# Patient Record
Sex: Female | Born: 1935
Health system: Southern US, Community
[De-identification: ages and names within clinical notes are randomized; demographics above are authoritative.]

## PROBLEM LIST (undated history)

## (undated) DIAGNOSIS — I10 Essential (primary) hypertension: Secondary | ICD-10-CM

## (undated) DIAGNOSIS — D649 Anemia, unspecified: Secondary | ICD-10-CM

## (undated) DIAGNOSIS — R55 Syncope and collapse: Secondary | ICD-10-CM

## (undated) DIAGNOSIS — I2510 Atherosclerotic heart disease of native coronary artery without angina pectoris: Secondary | ICD-10-CM

## (undated) DIAGNOSIS — N189 Chronic kidney disease, unspecified: Secondary | ICD-10-CM

## (undated) DIAGNOSIS — D509 Iron deficiency anemia, unspecified: Secondary | ICD-10-CM

## (undated) DIAGNOSIS — E119 Type 2 diabetes mellitus without complications: Secondary | ICD-10-CM

## (undated) DIAGNOSIS — I517 Cardiomegaly: Secondary | ICD-10-CM

## (undated) DIAGNOSIS — E785 Hyperlipidemia, unspecified: Secondary | ICD-10-CM

## (undated) DIAGNOSIS — R001 Bradycardia, unspecified: Secondary | ICD-10-CM

## (undated) DIAGNOSIS — I480 Paroxysmal atrial fibrillation: Secondary | ICD-10-CM

## (undated) HISTORY — PX: CARDIAC CATHETERIZATION: SHX172

## (undated) HISTORY — PX: CORONARY ANGIOPLASTY: SHX604

## (undated) HISTORY — PX: CORONARY ARTERY BYPASS GRAFT: SHX141

## (undated) HISTORY — DX: Paroxysmal atrial fibrillation: I48.0

## (undated) HISTORY — PX: CHOLECYSTECTOMY: SHX55

## (undated) HISTORY — DX: Anemia, unspecified: D64.9

## (undated) HISTORY — DX: Cardiomegaly: I51.7

## (undated) HISTORY — DX: Chronic kidney disease, unspecified: N18.9

## (undated) HISTORY — DX: Atherosclerotic heart disease of native coronary artery without angina pectoris: I25.10

## (undated) HISTORY — DX: Syncope and collapse: R55

## (undated) HISTORY — DX: Essential (primary) hypertension: I10

## (undated) HISTORY — DX: Bradycardia, unspecified: R00.1

## (undated) HISTORY — DX: Iron deficiency anemia, unspecified: D50.9

## (undated) HISTORY — DX: Type 2 diabetes mellitus without complications: E11.9

## (undated) HISTORY — DX: Hyperlipidemia, unspecified: E78.5

---

## 1972-09-18 HISTORY — PX: PARTIAL HYSTERECTOMY: SHX80

## 2001-04-09 ENCOUNTER — Inpatient Hospital Stay (HOSPITAL_COMMUNITY): Admission: AD | Admit: 2001-04-09 | Discharge: 2001-04-17 | Payer: Self-pay | Admitting: Cardiology

## 2001-04-09 ENCOUNTER — Encounter: Payer: Self-pay | Admitting: Cardiology

## 2001-04-12 ENCOUNTER — Encounter: Payer: Self-pay | Admitting: Thoracic Surgery (Cardiothoracic Vascular Surgery)

## 2001-04-13 ENCOUNTER — Encounter: Payer: Self-pay | Admitting: Thoracic Surgery (Cardiothoracic Vascular Surgery)

## 2001-04-14 ENCOUNTER — Encounter: Payer: Self-pay | Admitting: Thoracic Surgery (Cardiothoracic Vascular Surgery)

## 2001-04-15 ENCOUNTER — Encounter: Payer: Self-pay | Admitting: Thoracic Surgery (Cardiothoracic Vascular Surgery)

## 2002-11-07 ENCOUNTER — Ambulatory Visit (HOSPITAL_COMMUNITY): Admission: RE | Admit: 2002-11-07 | Discharge: 2002-11-08 | Payer: Self-pay | Admitting: Internal Medicine

## 2002-11-07 ENCOUNTER — Encounter: Payer: Self-pay | Admitting: Cardiology

## 2005-04-24 ENCOUNTER — Ambulatory Visit: Payer: Self-pay | Admitting: Oncology

## 2005-07-05 ENCOUNTER — Ambulatory Visit: Payer: Self-pay | Admitting: Oncology

## 2005-09-07 ENCOUNTER — Ambulatory Visit: Payer: Self-pay | Admitting: Oncology

## 2005-11-09 ENCOUNTER — Ambulatory Visit: Payer: Self-pay | Admitting: Oncology

## 2006-01-04 ENCOUNTER — Ambulatory Visit: Payer: Self-pay | Admitting: Oncology

## 2006-03-01 ENCOUNTER — Ambulatory Visit: Payer: Self-pay | Admitting: Oncology

## 2006-04-26 ENCOUNTER — Ambulatory Visit: Payer: Self-pay | Admitting: Oncology

## 2006-06-21 ENCOUNTER — Ambulatory Visit: Payer: Self-pay | Admitting: Oncology

## 2006-08-16 ENCOUNTER — Ambulatory Visit: Payer: Self-pay | Admitting: Oncology

## 2006-10-11 ENCOUNTER — Ambulatory Visit: Payer: Self-pay | Admitting: Oncology

## 2007-05-23 ENCOUNTER — Ambulatory Visit: Payer: Self-pay | Admitting: Oncology

## 2011-02-03 NOTE — Op Note (Signed)
[facility]  Patient:    Regina Herrera, Regina Herrera                       MRN: [id number] Proc. Date: 03/13/01 Adm. Date:  SN:[id number] Attending:  Modesto Charon CC:         Shiv K. Harsh, M.D.  Dr. Marco Collie in [place], [place]. Olevia Perches, M.D. [facility]  CVTS office   Operative Report  PREOPERATIVE DIAGNOSIS:  Unstable angina, left main and severe two vessel coronary disease.  POSTOPERATIVE DIAGNOSIS:  Unstable angina, left main and severe two vessel coronary disease.  PROCEDURE:  Median sternotomy, extracorporeal circulation, coronary artery bypass grafting x 4 (LIMA to LAD, saphenous vein graft to first diagonal, saphenous vein graft to distal right coronary, saphenous vein graft to second obtuse marginal).  SURGEON:  Revonda Standard. Roxan Hockey, M.D.  ASSISTANT:  Sueanne Margarita, P.A.  ANESTHESIA:  General.  FINDINGS:  _______ overall good poor quality, several separate segments used for grafting from both thighs.  These were fair quality segments.  First diagonal fair quality target, LAD, RCA, and OM2 good quality targets.  Normal left ventricle.  Ascending aorta with no atherosclerosis.  CLINICAL NOTE:  The patient is a 77 year old female with non-insulin-dependent diabetes and a family history of coronary disease, as well as hypercholesterolemia.  She presented with an episode of unstable angina.  She actually had two episodes of the angina, and then related this to her doctor three days earlier.  She subsequently was admitted and workup was begun.  She was transferred to [facility] and had cardiac catheterization which revealed moderate left main disease with a 50% stenosis of complex proximal LAD with post-stenotic aneurysm, and involvement of first diagonal, and a 95% ostial RCA lesion.  The patient was referred for coronary artery bypass grafting.  The indications, risks, benefits, and alternative procedures were discussed in detail  with the patient.  She understood and accepted them, and agreed to proceed.  DESCRIPTION OF PROCEDURE:  The patient was brought to the preoperative area on April 12, 2001.  Lines were placed to monitor arterial, central venous, and pulmonary arterial pressure.  EKG leads were placed for continuous telemetry. The patient was taken to the operating room, anesthetized, and intubated.  A Foley catheter was placed and intravenous antibiotics were administered.  The chest, abdomen, and legs were prepped and draped in the usual fashion.  A median sternotomy was performed, and the left internal mammary artery was harvested in the standard fashion.  Of note, the mammary harvest was difficult secondary to close adherence of the mammary to the costal cartilages and the level of the third and fourth costal cartilages.  Sharp dissection was used in these regions rather than electrocautery.  The patient was fully heparinized prior to dividing the distal end of the mammary artery.  Simultaneously, an incision was then made in the medial aspect of the right leg and the greater saphenous vein was harvested from the groin downward.  The vein overall was of poor quality, but there were areas that were usable for grafting.  The pericardium was opened.  The ascending aorta was inspected and palpated. There was no palpable atherosclerotic disease.  The aorta was cannulated via concentric 2-0 Ethibond pledgeted pursestring sutures.  A dual stage venous cannula was placed via pursestring suture in the right atrial appendage. Cardiopulmonary bypass was instituted and the patient was cooled to 32 degrees Celsius.  The coronary arteries were  inspected and anastomotic sites were chosen.  The conduits were inspected and it was found that there was inadequate vein for all three of the bypasses.  The initial plan had been to bypass the LAD, distal right, and second obtuse marginal.  However, on intraoperative  inspection, the first diagonal which did not appear graftable by catheterization did appear graftable intraoperatively.  Therefore, an incision was made in the left groin, and the greater saphenous vein was harvested from the left thigh as well.  This vein was overall better quality than the right.  However, it did trifurcate in the mid thigh.  It became unusable below that point.  A foam pad was placed in the pericardium to protect the left phrenic nerve.  A temperature probe was placed in the myocardium septum and a cardioplegia cannula was placed in the ascending aorta.  The aorta was cross-clamped.  The left ventricle was emptied via the aortic root vent.  Cardiac arrest then was achieved with a combination of cold antegrade blood cardioplegia and topical ice saline.  Seven hundred cubic centimeters of cardioplegia was administered.  The myocardial septal temperature was 11 degrees Celsius.  The following distal anastomoses were performed.  First, a reverse saphenous vein graft was placed end-to-side to the distal right coronary artery.  This was a large caliber relatively thin-walled vein of fair quality.  The distal right was a 1.5 mm good quality target.  The anastomosis was performed end-to-side with a running 7-0 Prolene suture.  At the completion of the anastomosis, cardioplegia was administered.  There was good flow and there was good hemostasis.  Next, a reverse saphenous vein graft was placed end-to-side to the second obtuse marginal branch of the left circumflex coronary artery.  This saphenous vein also was of fair quality.  The second obtuse marginal was a 1.5 mm good quality target.  The anastomosis was performed end-to-side with a running 7-0 Prolene suture.  At the completion of the anastomosis, it was proved  proximally and distally to ensure patency.  Cardioplegia was administered. There was good flow and good hemostasis.  Next, a reverse saphenous vein graft was  placed end-to-side to the first diagonal.  This was a smaller caliber and still fair quality.  It was placed to the first diagonal which was a 1 mm fair quality vessel.  There was disease distal to the anastomosis, but a 1 mm probe did pass beyond the bifurcation. The anastomosis was performed end-to-side with a running 7-0 Prolene suture. At the completion of the anastomosis, there was good flow.  Cardioplegia was administered and there was good hemostasis.  Next, the left internal mammary artery was brought through a window in a pericardium anterior to the left phrenic nerve.  The distal end of the mammary artery was spatulated and was anastomosed end-to-side to the distal LAD.  The LAD was a 2 mm good quality target.  The mammary was 2.5 mm good quality conduit with excellent flow.  The anastomosis was performed with a running 8-0 Prolene suture.  At the completion of the mammary to LAD anastomosis, the bulldog clamp was removed from the mammary artery.  Immediate and rapid septal rewarming was noted.  Lidocaine was administered.  The mammary and pedicle were tacked to the epicardial surface of the heart with 6-0 Prolene sutures. The aortic cross-clamp was removed.  The total cross-clamp time was 49 minutes.  The patient resumed breathing spontaneously and did not require defibrillation.  A partial occlusion clamp was placed  on the ascending aorta.  The cardioplegia cannula was removed.  The proximal vein graft anastomoses were performed with 4.0 mm punch aortotomies with running 6-0 Prolene sutures.  At the completion of the final proximal anastomosis, the patient was placed in the Trendelenburg position, air was allowed to vent as the partial clamp was removed.  The suture then was tied, air was aspirated from the vein grafts.  The bulldog clamps were removed.  All proximal and distal anastomoses were inspected for hemostasis.  There was bleeding from the right coronary proximal  anastomosis. An attempt to fix this with a 6-0 Prolene sutures led to some distortion of the proximal segment of the vein.  Therefore, a partial occlusion clamp was placed back on the ascending aorta, and the anastomosis was taken down, and redone again using a running 6-0 Prolene suture.  Deairing maneuvers were performed again as the partial clamp was removed.  At this time, all proximal and distal anastomoses had good hemostasis. Epicardial pacing wires were placed on the right ventricle and right atrium. Rewarming had been begun during the mammary to LAD anastomosis, and when the core temperature reached 37 degrees Celsius, the patient was weaned from cardiopulmonary bypass.  She was atrially paced for rate and on no inotropic support at the time of separation from bypass.  The total bypass time was 192 minutes.  The initial cardiac index was greater than 2 L per minute per sq m.  The patient did have some variability in blood pressure, but maintained good cardiac index throughout the remainder of the procedure.  A test dose of Protamine was administered and was well-tolerated.  The atrial and aorta cannulae were removed.  The remainder of the Protamine was administered without incident.  An additional dose of antibiotics was given. The chest was irrigated with 1 L of warm normal saline containing 1 g of vancomycin.  Hemostasis was achieved.  The pericardium was loosely reapproximated with interrupted 3-0 silk sutures and came together easily without tension on the underlying structures.  The left pleural and two mediastinal chest tubes were placed.  The sternum was closed with heave gauge interrupted stainless steel wires.  The pectoralis fascia was closed with a running #1 Vicryl suture.  The subcutaneous tissue and the skin was closed in the routine fashion with a subcuticular skin suture.  The leg incisions were closed in two layers, and 19 Blake drains were left in both thighs,  and the skin was closed with staples.  All sponge, needle, and instruments were correct at the end of the procedure. The patient was taken from the operating room to the surgical intensive care unit intubated, but in stable condition. DD:  04/12/01 TD:  04/14/01 Job: [id number]

## 2011-02-03 NOTE — Cardiovascular Report (Signed)
NAME:  Regina Herrera, Regina Herrera                          ACCOUNT NO.:  [id number]   MEDICAL RECORD NO.:  [id number]                   PATIENT TYPE:  OIB   LOCATION:  2899                                 FACILITY:  [facility]   PHYSICIAN:  Ethelle Lyon, M.D. [facility]         DATE OF BIRTH:  Feb 19, 1927   DATE OF PROCEDURE:  11/07/2002  DATE OF DISCHARGE:                              CARDIAC CATHETERIZATION   PROCEDURE:  1. Left heart catheterization.  2. Left ventriculography.  3. Saphenous vein graft angiography x3.  4. Selective left subclavian angiography, selective LIMA angiography, stent     to the ostial RCA.   INDICATIONS:  The patient is a 77 year old lady status post coronary artery  bypass grafting in July 2002.  She has done well post bypass grafting  without angina.  However, Dr. Driscilla Moats obtained an exercise tolerance test  which was positive by electrocardiographic criteria.  He therefore referred  her for diagnostic angiography.   DIAGNOSTIC TECHNIQUE:  Informed consent was obtained.  Under 1% lidocaine  local anesthesia, a 6-French sheath was placed in the right femoral artery  using the modified Seldinger technique.  Diagnostic angiography was  performed using no-torque right, Judkins left catheters to image the native  coronaries, a JR-4 to image the saphenous vein grafts, JR-4 to engage the  left subclavian, and LIMA catheter for selective LIMA angiography.  Ventriculography was performed from the RAO projection by power injection.  The case then proceeded to intervention.   DIAGNOSTIC FINDINGS:  1. LV:  EF of approximately 65%.  No regional wall motion abnormality.  2. No aortic stenosis or mitral regurgitation.  3. LV:  164/0/12.  4. Left main:  Angiographically normal.  5. LAD:  The LAD has an ostial 90% lesion followed by a 90% lesion of the     proximal vessel just proximal to an aneurysm.  There is a large diagonal     arising just proximal to the aneurysm.  The LIMA to  the LAD is widely     patent with normal runoff.  The saphenous vein graft to D1 is also widely     patent with normal run off.  6. Circumflex:  The circumflex is a large, codominant vessel giving rise to     a single large branching obtuse marginal and a PDA.  The saphenous vein     graft to the marginal is widely patent.  7. RCA:  The RCA is a small, codominant vessel.  There is a 95% stenosis of     the ostium.  The saphenous vein graft to the RCA is occluded at its     ostium.  8. Left subclavian artery is widely patent without stenosis.   PCI TECHNIQUE:  Anticoagulation was initiated with heparin and Integrelin to  achieve an ACT of greater than 200 seconds.  I was unable to engage the  right coronary with multiple guides including AR-1,  AL-1, right hockey  stick, and right bypass graft.  Therefore, the graft was re-engaged, using  the diagnostic no-torque right catheter.  A Sport wire was advanced via this  catheter into the mid portion of the RCA.  The diagnostic catheter was then  removed over this wire.  The right bypass guide was then advanced over the  Sport wire and engaged in the ostium of the RCA.  The wire was then advanced  to the distal RCA.  The lesion of the ostium of the RCA was then pre-dilated  using a 2.25 x15 mm Quantum balloon at 18 atmospheres.  There was incomplete  expansion at the ostium.  Therefore, a 2.5 x10 mm cutting ballon was  positioned at the ostium and inflated for a total of 3 inflations each to 10  atmospheres.  These did result in complete expansion.  Therefore, a 2.5 x18  mm CYPHER stent was positioned such as to cover the ostium, and protrude  approximately 1 mm into the aorta.  It was deployed at 18 atmospheres.  A  distal portion of this stent was then post dilated using a 2.75 mm Quantum  balloon at 18 atmospheres.  The proximal portion of this stent was post-  dilated using the same balloon at 22 atmospheres.  Despite this high  pressure  inflation, final angiograms demonstrated approximately 10% residual  stenosis at the ostium.  There was TIMI 3 flow to the distal vessel.  The  patient tolerated the procedure well and was transferred to the holding room  in stable condition.   COMPLICATIONS:  None.   IMPRESSION/RECOMMENDATIONS:  Successful stenting of the ostium of the right  coronary artery resulting in approximately 10% residual stenosis and TIMI 3  flow.   PLAN:  1. Continue the Integrelin for 18 hours.  2. Sheath will be removed when the ACT is less than 150 seconds.  Since a     drug-eluting stent was placed, Plavix should be continued for a minimum     of three months.  Aspirin should be continued indefinitely.                                               Ethelle Lyon, M.D. [facility]    WED/MEDQ  D:  11/07/2002  T:  11/08/2002  Job:  [id number]   cc:   Josue Hector Harsh  [place]  [place] [address]  Fax: [place], Dr.  Harmon Dun

## 2011-02-03 NOTE — Discharge Summary (Signed)
   NAME:  Regina Herrera, Regina Herrera                          ACCOUNT NO.:  [id number]   MEDICAL RECORD NO.:  [id number]                   PATIENT TYPE:  OIB   LOCATION:  6529                                 FACILITY:  [facility]   PHYSICIAN:  Ethelle Lyon, M.D. [facility]         DATE OF BIRTH:  04-Jan-1927   DATE OF ADMISSION:  11/07/2002  DATE OF DISCHARGE:  11/08/2002                           DISCHARGE SUMMARY - REFERRING   HISTORY OF PRESENT ILLNESS:  The patient is a very pleasant 77 year old  white female, diabetic who underwent CABG March 13, 2001, by Dr. Roxan Hockey  when she received an LIMA to the LAD, SVG to the diagonal #1, SVG to the  distal RCA, and SVG of the OM-2.  Thereafter, she did well but recently has  developed recurrent symptoms suggestive of recurrent ischemic etiology.  She  has had some mild associated hypertension.  She had a Cardiolite which  revealed marked ST segment elevation and a low level exercise, and it was  felt that the patient needed cardiac catheterization.   ACCESSORY CLINICAL FINDINGS:  Electrolytes and renal function totally  normal.  Hemoglobin 12.1 along with hematocrit of 35.5.   HOSPITAL COURSE:  The patient underwent same day cardiac catheterization.  Her SVG to the RCA was totalled and she had 95% proximal lesion in her  native right.  This was stented with a 2.5 x 18 Cypher stent to 0% without  complication.  Her LIMA to the LAD was patent, SVG to the diagonal was  patent, and SVG to the OM-2 was patent.  She tolerated the procedure well  and went home the following day.   FINAL DIAGNOSES:  1. Coronary artery disease with prior coronary artery bypass graft x4, June     2002.  He was re-catheterized on this occasion and found to have total     occlusion of her vein graft to the right and a 95% lesion in the proximal     native right that was stented to 0% without complication.  The remainder     of her grafts were widely patent.  2. Diabetes -  controlled.  3. Hypertension - treated.   DISPOSITION:  The patient will continue on his same medications, plus Plavix  75 mg daily for three months.                 Margarita Sermons, P.A. [facility]                     Ethelle Lyon, M.D. [facility]    BY/MEDQ  D:  11/08/2002  T:  11/08/2002  Job:  [id number]   cc:   Josue Hector Harsh  [place]  [place] [address]  Fax: [phone]   Marco Collie, M.D.  [place], [facility]

## 2011-02-03 NOTE — H&P (Signed)
NAME:  Regina Herrera, Regina Herrera                          ACCOUNT NO.:  [id number]   MEDICAL RECORD NO.:  [id number]                   PATIENT TYPE:  OIB   LOCATION:  2899                                 FACILITY:  [facility]   PHYSICIAN:  Ethelle Lyon, M.D. [facility]         DATE OF BIRTH:  1927/08/27   DATE OF ADMISSION:  11/07/2002  DATE OF DISCHARGE:                                HISTORY & PHYSICAL   PRIMARY CARDIOLOGIST:  Dr. Josue Hector Harsh.   FAMILY PHYSICIAN:  Dr. Marco Collie.   CHIEF COMPLAINT:  Abnormal Cardiolite.   HISTORY OF PRESENT ILLNESS:  The patient is a 77 year old female with a  history of coronary artery disease who had bypass surgery in 2001.  She has  had no recent chest pain but on a yearly visit a Cardiolite was scheduled as  a routine test.  She had ST depression during the test although there was no  scar or ischemia seen on the images.  Because of the abnormal  electrocardiogram, however, she was recommended for catheterization.   The patient has a history of coronary artery disease and had bypass surgery  at [facility] in 2002.  Since then she has done very well and works  part-time at [employer].  She has some problems with fatigue after she works a  long day but generally has a good energy level and denies any kind of chest  pain or shortness of breath.   PAST MEDICAL HISTORY:  1. Non-insulin-dependent diabetes mellitus.  2. Hyperlipidemia.  3. Family history of premature coronary artery disease and hypertension.  4. She has had problems with hypotension in the past when her Altace was     increased from 5 to 10 mg a day, but once it was decreased back to 5 mg a     day she has done very well.   PAST SURGICAL HISTORY:  1. Aortocoronary bypass surgery.  2. Hysterectomy.  3. Cardiac catheterization.   SOCIAL HISTORY:  She lives in [place] alone but has family nearby, and they  are very attentive.  She works part-time at [employer].  She has no history of  tobacco or ETOH use.   FAMILY HISTORY:  Her mother died in her 81s with a history of CVA and MI,  and her father died in his 68s secondary to MI.  She has one sister and one  brother who have died secondary to MI, and she has a sister who is alive  post-CABG and a brother who is alive after MIs.   ALLERGIES:  CODEINE and intolerance and lower extremity swelling secondary  to AVANDIA.   CURRENT MEDICATIONS:  1. Glucophage 1000 mg b.i.d.  2. Glucotrol XL 10 mg daily.  3. Lipitor 60 mg daily.  4. Tricor 160 mg daily.  5. Accupril 5 mg daily.  6. Premarin 0.625 mg daily.  7. Zyrtec 10 mg daily.  8. Lanoxin 0.125 mg  daily.  9. Lopressor 50 mg, 1/2 tablet b.i.d.  10.      Aspirin 81 mg daily.   REVIEW OF SYSTEMS:  Positive for lower extremity swelling secondary to  Avandia; that has since resolved.  She does have occasional daytime edema,  but this resolves after she gets home and puts her feet up.  She denies any  shortness of breath, dyspnea on exertion, paroxysmal nocturnal dyspnea, or  palpitations.  She denies any GI problems.  Review of systems is otherwise  negative.   PHYSICAL EXAMINATION:  VITAL SIGNS:  Temperature 97.1, with a blood pressure  of 126/55 and a heart rate of 64.  Respirations are 16 and not labored.  GENERAL:  She is a well-developed, elderly white female in no acute  distress.  HEENT:  Normocephalic, atraumatic, with pupils are equal, round, and  reactive to light and accommodation.  Nares without discharge.  NECK:  There is no lymphadenopathy or JVD noted.  There is no thyromegaly.  She has a soft left carotid bruit that may be a radiation murmur.  CARDIOVASCULAR:  Her heart is regular rate and rhythm with an S1 and S2 and  S4 noted as well.  She has a 1 or 2/6 systolic ejection murmur left upper  sternal border.  Pulses are 2+ all four extremities with bilateral femoral  bruits right greater than left.  LUNGS:  Clear to auscultation bilaterally.  SKIN:   No rashes or lesions are noted.  ABDOMEN:  Soft and nontender with active bowel sounds and no  hepatosplenomegaly noted.  EXTREMITIES:  There is no cyanosis, clubbing, or edema.  MUSCULOSKELETAL:  There is no joint deformity or fusions and no spine or CVA  tenderness.  NEUROLOGIC:  She is alert and oriented x 3, with cranial nerves II-XII  grossly intact although her left eye is crossed.  She has age-appropriate  strength in all extremities.   LABORATORY DATA:  Chest x-ray:  Pending at the time of dictation.   EKG:  Shows a rate of 53 beats per minute, sinus bradycardia with depressed  ST segments inferiorly.  Comparison is with an EKG from July 2002, and the  inferior ST depression is not seen.   Laboratory values:  Hemoglobin 12.1, hematocrit 35.5, WBC 7.9, platelets  317.  Sodium 139, potassium 4.4, chloride 106, CO2 26, BUN 19, creatinine 1,  glucose 206.  INR 0.9, with a PTT of 26.   ASSESSMENT AND PLAN:  1. Abnormal Cardiolite with an abnormal electrocardiogram:  The patient is     for cardiac catheterization today.  She has held her a.m. diabetic     medications and will be covered with sliding scale insulin.  Further     evaluation and treatment is dependent on the results of the     catheterization.  2. Bilateral femoral bruits:  We will prep both groins, and if the patient     stays in the hospital M.D. is to advise     whether lower extremity Dopplers are to be performed or followed up as an     outpatient.  Peripheral vascular consultation per M.D.  3. The patient is otherwise stable and will be continued on her home     medications.     Davis Gourd, P.A. [facility]                  Ethelle Lyon, M.D. [facility]    RG/MEDQ  D:  11/07/2002  T:  11/07/2002  Job:  [id number]

## 2015-06-09 DIAGNOSIS — I10 Essential (primary) hypertension: Secondary | ICD-10-CM

## 2015-06-09 DIAGNOSIS — I110 Hypertensive heart disease with heart failure: Secondary | ICD-10-CM | POA: Insufficient documentation

## 2015-06-09 DIAGNOSIS — I2510 Atherosclerotic heart disease of native coronary artery without angina pectoris: Secondary | ICD-10-CM | POA: Insufficient documentation

## 2015-06-09 DIAGNOSIS — E785 Hyperlipidemia, unspecified: Secondary | ICD-10-CM

## 2015-06-09 HISTORY — DX: Hyperlipidemia, unspecified: E78.5

## 2015-06-09 HISTORY — DX: Atherosclerotic heart disease of native coronary artery without angina pectoris: I25.10

## 2015-06-09 HISTORY — DX: Essential (primary) hypertension: I10

## 2015-06-10 DIAGNOSIS — D509 Iron deficiency anemia, unspecified: Secondary | ICD-10-CM

## 2015-06-10 DIAGNOSIS — I517 Cardiomegaly: Secondary | ICD-10-CM

## 2015-06-10 HISTORY — DX: Cardiomegaly: I51.7

## 2015-06-10 HISTORY — DX: Iron deficiency anemia, unspecified: D50.9

## 2016-05-08 DIAGNOSIS — N189 Chronic kidney disease, unspecified: Secondary | ICD-10-CM | POA: Diagnosis not present

## 2016-05-08 DIAGNOSIS — D631 Anemia in chronic kidney disease: Secondary | ICD-10-CM | POA: Diagnosis not present

## 2016-08-01 DIAGNOSIS — N390 Urinary tract infection, site not specified: Secondary | ICD-10-CM | POA: Diagnosis not present

## 2016-08-01 DIAGNOSIS — D649 Anemia, unspecified: Secondary | ICD-10-CM

## 2016-08-02 DIAGNOSIS — N390 Urinary tract infection, site not specified: Secondary | ICD-10-CM | POA: Diagnosis not present

## 2016-08-02 DIAGNOSIS — D649 Anemia, unspecified: Secondary | ICD-10-CM | POA: Diagnosis not present

## 2016-08-03 DIAGNOSIS — I2510 Atherosclerotic heart disease of native coronary artery without angina pectoris: Secondary | ICD-10-CM

## 2016-08-03 DIAGNOSIS — E1165 Type 2 diabetes mellitus with hyperglycemia: Secondary | ICD-10-CM

## 2016-08-03 DIAGNOSIS — K922 Gastrointestinal hemorrhage, unspecified: Secondary | ICD-10-CM

## 2016-08-03 DIAGNOSIS — D649 Anemia, unspecified: Secondary | ICD-10-CM

## 2016-08-03 DIAGNOSIS — N189 Chronic kidney disease, unspecified: Secondary | ICD-10-CM

## 2016-08-18 DIAGNOSIS — D631 Anemia in chronic kidney disease: Secondary | ICD-10-CM | POA: Diagnosis not present

## 2016-08-18 DIAGNOSIS — N189 Chronic kidney disease, unspecified: Secondary | ICD-10-CM | POA: Diagnosis not present

## 2016-08-18 DIAGNOSIS — D509 Iron deficiency anemia, unspecified: Secondary | ICD-10-CM | POA: Diagnosis not present

## 2016-09-19 DIAGNOSIS — J3489 Other specified disorders of nose and nasal sinuses: Secondary | ICD-10-CM | POA: Diagnosis not present

## 2016-09-19 DIAGNOSIS — Z794 Long term (current) use of insulin: Secondary | ICD-10-CM | POA: Diagnosis not present

## 2016-09-19 DIAGNOSIS — E1159 Type 2 diabetes mellitus with other circulatory complications: Secondary | ICD-10-CM | POA: Diagnosis not present

## 2016-10-10 DIAGNOSIS — Z794 Long term (current) use of insulin: Secondary | ICD-10-CM | POA: Diagnosis not present

## 2016-10-10 DIAGNOSIS — E1159 Type 2 diabetes mellitus with other circulatory complications: Secondary | ICD-10-CM | POA: Diagnosis not present

## 2016-10-30 DIAGNOSIS — E119 Type 2 diabetes mellitus without complications: Secondary | ICD-10-CM | POA: Diagnosis not present

## 2016-10-30 DIAGNOSIS — E1121 Type 2 diabetes mellitus with diabetic nephropathy: Secondary | ICD-10-CM | POA: Diagnosis not present

## 2016-10-30 DIAGNOSIS — Z0000 Encounter for general adult medical examination without abnormal findings: Secondary | ICD-10-CM | POA: Diagnosis not present

## 2016-10-30 DIAGNOSIS — Z794 Long term (current) use of insulin: Secondary | ICD-10-CM | POA: Diagnosis not present

## 2016-10-30 DIAGNOSIS — Z139 Encounter for screening, unspecified: Secondary | ICD-10-CM | POA: Diagnosis not present

## 2016-10-30 DIAGNOSIS — E1159 Type 2 diabetes mellitus with other circulatory complications: Secondary | ICD-10-CM | POA: Diagnosis not present

## 2016-10-30 DIAGNOSIS — Z1389 Encounter for screening for other disorder: Secondary | ICD-10-CM | POA: Diagnosis not present

## 2016-11-06 DIAGNOSIS — M810 Age-related osteoporosis without current pathological fracture: Secondary | ICD-10-CM | POA: Diagnosis not present

## 2016-11-06 DIAGNOSIS — Z794 Long term (current) use of insulin: Secondary | ICD-10-CM | POA: Diagnosis not present

## 2016-11-06 DIAGNOSIS — E1159 Type 2 diabetes mellitus with other circulatory complications: Secondary | ICD-10-CM | POA: Diagnosis not present

## 2016-11-06 DIAGNOSIS — Z6823 Body mass index (BMI) 23.0-23.9, adult: Secondary | ICD-10-CM | POA: Diagnosis not present

## 2016-11-13 DIAGNOSIS — I2510 Atherosclerotic heart disease of native coronary artery without angina pectoris: Secondary | ICD-10-CM | POA: Diagnosis not present

## 2016-11-13 DIAGNOSIS — D631 Anemia in chronic kidney disease: Secondary | ICD-10-CM | POA: Diagnosis not present

## 2016-11-13 DIAGNOSIS — N189 Chronic kidney disease, unspecified: Secondary | ICD-10-CM | POA: Diagnosis not present

## 2016-11-13 DIAGNOSIS — E119 Type 2 diabetes mellitus without complications: Secondary | ICD-10-CM | POA: Diagnosis not present

## 2016-11-13 DIAGNOSIS — Z794 Long term (current) use of insulin: Secondary | ICD-10-CM | POA: Diagnosis not present

## 2016-11-13 DIAGNOSIS — I10 Essential (primary) hypertension: Secondary | ICD-10-CM | POA: Diagnosis not present

## 2016-11-13 DIAGNOSIS — Z6823 Body mass index (BMI) 23.0-23.9, adult: Secondary | ICD-10-CM | POA: Diagnosis not present

## 2016-11-13 DIAGNOSIS — D638 Anemia in other chronic diseases classified elsewhere: Secondary | ICD-10-CM | POA: Diagnosis not present

## 2016-11-13 DIAGNOSIS — E1159 Type 2 diabetes mellitus with other circulatory complications: Secondary | ICD-10-CM | POA: Diagnosis not present

## 2016-11-30 DIAGNOSIS — E1159 Type 2 diabetes mellitus with other circulatory complications: Secondary | ICD-10-CM | POA: Diagnosis not present

## 2016-11-30 DIAGNOSIS — E1169 Type 2 diabetes mellitus with other specified complication: Secondary | ICD-10-CM | POA: Diagnosis not present

## 2016-11-30 DIAGNOSIS — N184 Chronic kidney disease, stage 4 (severe): Secondary | ICD-10-CM | POA: Diagnosis not present

## 2016-12-11 DIAGNOSIS — E1122 Type 2 diabetes mellitus with diabetic chronic kidney disease: Secondary | ICD-10-CM | POA: Diagnosis not present

## 2016-12-11 DIAGNOSIS — E1159 Type 2 diabetes mellitus with other circulatory complications: Secondary | ICD-10-CM | POA: Diagnosis not present

## 2016-12-11 DIAGNOSIS — Z794 Long term (current) use of insulin: Secondary | ICD-10-CM | POA: Diagnosis not present

## 2016-12-11 DIAGNOSIS — N183 Chronic kidney disease, stage 3 (moderate): Secondary | ICD-10-CM | POA: Diagnosis not present

## 2017-01-02 DIAGNOSIS — E1169 Type 2 diabetes mellitus with other specified complication: Secondary | ICD-10-CM | POA: Diagnosis not present

## 2017-01-02 DIAGNOSIS — E1159 Type 2 diabetes mellitus with other circulatory complications: Secondary | ICD-10-CM | POA: Diagnosis not present

## 2017-01-02 DIAGNOSIS — I1310 Hypertensive heart and chronic kidney disease without heart failure, with stage 1 through stage 4 chronic kidney disease, or unspecified chronic kidney disease: Secondary | ICD-10-CM | POA: Diagnosis not present

## 2017-01-02 DIAGNOSIS — E782 Mixed hyperlipidemia: Secondary | ICD-10-CM | POA: Diagnosis not present

## 2017-01-11 DIAGNOSIS — D638 Anemia in other chronic diseases classified elsewhere: Secondary | ICD-10-CM | POA: Diagnosis not present

## 2017-01-11 DIAGNOSIS — D631 Anemia in chronic kidney disease: Secondary | ICD-10-CM | POA: Diagnosis not present

## 2017-01-11 DIAGNOSIS — N189 Chronic kidney disease, unspecified: Secondary | ICD-10-CM | POA: Diagnosis not present

## 2017-01-11 DIAGNOSIS — D509 Iron deficiency anemia, unspecified: Secondary | ICD-10-CM | POA: Diagnosis not present

## 2017-01-11 DIAGNOSIS — E119 Type 2 diabetes mellitus without complications: Secondary | ICD-10-CM | POA: Diagnosis not present

## 2017-04-02 DIAGNOSIS — E1159 Type 2 diabetes mellitus with other circulatory complications: Secondary | ICD-10-CM | POA: Diagnosis not present

## 2017-04-02 DIAGNOSIS — E1169 Type 2 diabetes mellitus with other specified complication: Secondary | ICD-10-CM | POA: Diagnosis not present

## 2017-04-02 DIAGNOSIS — N184 Chronic kidney disease, stage 4 (severe): Secondary | ICD-10-CM | POA: Diagnosis not present

## 2017-04-10 DIAGNOSIS — Z1389 Encounter for screening for other disorder: Secondary | ICD-10-CM | POA: Diagnosis not present

## 2017-04-10 DIAGNOSIS — E1169 Type 2 diabetes mellitus with other specified complication: Secondary | ICD-10-CM | POA: Diagnosis not present

## 2017-04-10 DIAGNOSIS — Z139 Encounter for screening, unspecified: Secondary | ICD-10-CM | POA: Diagnosis not present

## 2017-04-10 DIAGNOSIS — E782 Mixed hyperlipidemia: Secondary | ICD-10-CM | POA: Diagnosis not present

## 2017-04-10 DIAGNOSIS — E1159 Type 2 diabetes mellitus with other circulatory complications: Secondary | ICD-10-CM | POA: Diagnosis not present

## 2017-04-10 DIAGNOSIS — Z794 Long term (current) use of insulin: Secondary | ICD-10-CM | POA: Diagnosis not present

## 2017-04-10 DIAGNOSIS — Z789 Other specified health status: Secondary | ICD-10-CM | POA: Diagnosis not present

## 2017-04-10 DIAGNOSIS — M810 Age-related osteoporosis without current pathological fracture: Secondary | ICD-10-CM | POA: Diagnosis not present

## 2017-04-12 DIAGNOSIS — J302 Other seasonal allergic rhinitis: Secondary | ICD-10-CM | POA: Diagnosis not present

## 2017-04-12 DIAGNOSIS — J305 Allergic rhinitis due to food: Secondary | ICD-10-CM | POA: Diagnosis not present

## 2017-04-20 DIAGNOSIS — Z794 Long term (current) use of insulin: Secondary | ICD-10-CM | POA: Diagnosis not present

## 2017-04-20 DIAGNOSIS — E1159 Type 2 diabetes mellitus with other circulatory complications: Secondary | ICD-10-CM | POA: Diagnosis not present

## 2017-05-03 DIAGNOSIS — E1159 Type 2 diabetes mellitus with other circulatory complications: Secondary | ICD-10-CM | POA: Diagnosis not present

## 2017-05-03 DIAGNOSIS — Z789 Other specified health status: Secondary | ICD-10-CM | POA: Diagnosis not present

## 2017-05-03 DIAGNOSIS — Z794 Long term (current) use of insulin: Secondary | ICD-10-CM | POA: Diagnosis not present

## 2017-05-03 DIAGNOSIS — Z6824 Body mass index (BMI) 24.0-24.9, adult: Secondary | ICD-10-CM | POA: Diagnosis not present

## 2017-05-08 DIAGNOSIS — R55 Syncope and collapse: Secondary | ICD-10-CM

## 2017-05-08 DIAGNOSIS — R001 Bradycardia, unspecified: Secondary | ICD-10-CM

## 2017-05-08 DIAGNOSIS — I480 Paroxysmal atrial fibrillation: Secondary | ICD-10-CM

## 2017-05-08 DIAGNOSIS — E119 Type 2 diabetes mellitus without complications: Secondary | ICD-10-CM

## 2017-05-08 DIAGNOSIS — N189 Chronic kidney disease, unspecified: Secondary | ICD-10-CM

## 2017-05-08 DIAGNOSIS — D649 Anemia, unspecified: Secondary | ICD-10-CM

## 2017-05-08 HISTORY — DX: Paroxysmal atrial fibrillation: I48.0

## 2017-05-08 HISTORY — DX: Anemia, unspecified: D64.9

## 2017-05-08 HISTORY — DX: Chronic kidney disease, unspecified: N18.9

## 2017-05-08 HISTORY — DX: Bradycardia, unspecified: R00.1

## 2017-05-08 HISTORY — DX: Syncope and collapse: R55

## 2017-05-08 HISTORY — DX: Type 2 diabetes mellitus without complications: E11.9

## 2017-05-08 NOTE — Progress Notes (Signed)
Cardiology Office Note:    Date:  05/09/2017   ID:  Regina Herrera, DOB 1927/02/13, MRN [id number]  PCP:  Marco Collie, MD Please do a BMP and BNP in 1 week starting a loop diuretic Cardiologist:  Shirlee More, MD       ASSESSMENT:    1. Coronary artery disease of native artery of native heart with stable angina pectoris ([place])   2. Essential hypertension   3. Hyperlipidemia, unspecified hyperlipidemia type   4. Hx of CABG   5. SOB (shortness of breath) on exertion    PLAN:    In order of problems listed above:  1. Stable after both remote surgical and PCI revascularization I would continue her current medical treatment including aspirin and calcium channel blocker and lipid-lowering treatment at this time I don't think she requires an ischemia evaluation. 2. Stable blood pressure target continue current treatment 3. Stable continue combined treatment with very low-dose low intensity statin and Zetia and gemfibrozil 4. Stable 5. Clinically suspect element of diastolic heart failure with edema and exertional dyspnea I will withdraw her thiazide diuretic started on a minimum dose of loop diuretic and ask her in one week to go to her PCP office to have BMP and BNP performed. If unimproved then echocardiogram or even consideration of myocardial perfusion imaging would be warranted   Next appointment: 6 months   Medication Adjustments/Labs and Tests Ordered: Current medicines are reviewed at length with the patient today.  Concerns regarding medicines are outlined above.  Orders Placed This Encounter  Procedures  . EKG 12-Lead   Meds ordered this encounter  Medications  . furosemide (LASIX) 20 MG tablet    Sig: Take 1 tablet (20 mg total) by mouth 3 (three) times a week.    Dispense:  30 tablet    Refill:  6    Chief Complaint  Patient presents with  . Follow-up    1 year flup appt for CAD  . Shortness of Breath  . Edema    History of Present Illness:    Regina Herrera is a 83 y.o. female with a hx of CAD, Dyslipidemia, HTN, S/P CABG 03/13/17, S/P PCI  of her native RCA with Cypher DES 10/20/02  last seen in September 2017.She is now off clopidogrel with worsened anemia and transfused 2 units PRBC in Nov 2017.Since then she has done well overall but notices some hot humid weather and she short of breath walking outdoors. She takes a Dyazide diuretic and notices increased edema but has no orthopnea PND and has had no angina palpitation or syncope. Compliance with diet, lifestyle and medications: Yes Past Medical History:  Diagnosis Date  . Anemia 05/08/2017  . CKD (chronic kidney disease) 05/08/2017  . Coronary artery disease involving native coronary artery of native heart 06/09/2015   Overview:   . DM (diabetes mellitus) ([place]) 05/08/2017  . Essential hypertension 06/09/2015  . Hyperlipidemia 06/09/2015   Overview:  poor statin tolerance  . Iron deficiency anemia 06/10/2015  . LVH (left ventricular hypertrophy) 06/10/2015  . PAF (paroxysmal atrial fibrillation) ([place]) 05/08/2017  . Sinus bradycardia 05/08/2017  . Syncope 05/08/2017    Past Surgical History:  Procedure Laterality Date  . CARDIAC CATHETERIZATION    . CORONARY ANGIOPLASTY    . CORONARY ARTERY BYPASS GRAFT      Current Medications: Current Meds  Medication Sig  . amLODipine (NORVASC) 10 MG tablet Take 10 mg by mouth daily.  . Ascorbic Acid (VITAMIN C)  100 MG tablet Take 100 mg by mouth daily.  Marland Kitchen aspirin 81 MG chewable tablet Chew 81 mg by mouth daily.  . cetirizine (ZYRTEC) 10 MG tablet Take 10 mg by mouth daily.  . chlorthalidone (HYGROTON) 25 MG tablet Take 12.5 mg by mouth daily.  Marland Kitchen denosumab (PROLIA) 60 MG/ML SOLN injection Inject 1 Dose into the skin every 6 (six) months.  . ezetimibe (ZETIA) 10 MG tablet Take 10 mg by mouth daily.  . Ferrous Fumarate (HEMOCYTE - 106 MG FE) 324 (106 Fe) MG TABS tablet Take 324 mg of iron by mouth daily.  Marland Kitchen gemfibrozil (LOPID) 600 MG tablet Take 600  mg by mouth 2 (two) times daily.  Marland Kitchen glimepiride (AMARYL) 1 MG tablet Take 0.5 mg by mouth daily.  . Insulin Detemir (LEVEMIR FLEXTOUCH) 100 UNIT/ML Pen Inject 10 Units into the skin daily.  . isosorbide mononitrate (IMDUR) 30 MG 24 hr tablet Take 30 mg by mouth 3 (three) times daily.  . meclizine (ANTIVERT) 12.5 MG tablet Take 1 tablet by mouth every 8 (eight) hours as needed for dizziness.  . nitroGLYCERIN (NITROSTAT) 0.4 MG SL tablet Place 0.4 mg under the tongue every 5 (five) minutes as needed for chest pain.  . Omega-3 Fatty Acids (FISH OIL) 1000 MG CAPS Take 1 capsule by mouth 2 (two) times daily.  . pantoprazole (PROTONIX) 40 MG tablet Take 40 mg by mouth daily.  . pravastatin (PRAVACHOL) 10 MG tablet Take 5 mg by mouth daily.     Allergies:   Codeine   Social History   Social History  . Marital status: Widowed    Spouse name: N/A  . Number of children: N/A  . Years of education: N/A   Social History Main Topics  . Smoking status: Never Smoker  . Smokeless tobacco: Never Used  . Alcohol use No  . Drug use: No  . Sexual activity: Not Asked   Other Topics Concern  . None   Social History Narrative  . None     Family History: The patient's family history includes Cancer in her brother; Heart attack in her father; Heart disease in her brother and sister; Stroke in her mother. ROS:   Please see the history of present illness.    All other systems reviewed and are negative.  EKGs/Labs/Other Studies Reviewed:    The following studies were reviewed today:  EKG:  EKG ordered today.  The ekg ordered today demonstrates [facility] non specific ST and t changes  Recent Labs: No results found for requested labs within last 8760 hours.  Recent Lipid Panel No results found for: CHOL, TRIG, HDL, CHOLHDL, VLDL, LDLCALC, LDLDIRECT  Physical Exam:    VS:  BP 126/68 (BP Location: Right Arm, Patient Position: Sitting)   Pulse 85   Ht 5\' 2"  (1.575 m)   Wt 132 lb 12.8 oz (60.2 kg)    SpO2 95%   BMI 24.29 kg/m     Wt Readings from Last 3 Encounters:  05/09/17 132 lb 12.8 oz (60.2 kg)     GEN:  Well nourished, well developed in no acute distress HEENT: Normal NECK: No JVD; No carotid bruits LYMPHATICS: No lymphadenopathy CARDIAC: RRR, no murmurs, rubs, gallops RESPIRATORY:  Clear to auscultation without rales, wheezing or rhonchi  ABDOMEN: Soft, non-tender, non-distended MUSCULOSKELETAL:  2+ edema; No deformity  SKIN: Warm and dry NEUROLOGIC:  Alert and oriented x 3 PSYCHIATRIC:  Normal affect    Signed, Shirlee More, MD  05/09/2017 11:28 AM  [facility]

## 2017-05-09 ENCOUNTER — Ambulatory Visit (INDEPENDENT_AMBULATORY_CARE_PROVIDER_SITE_OTHER): Payer: PPO | Admitting: Cardiology

## 2017-05-09 ENCOUNTER — Encounter: Payer: Self-pay | Admitting: Cardiology

## 2017-05-09 VITALS — BP 126/68 | HR 85 | Ht 62.0 in | Wt 132.8 lb

## 2017-05-09 DIAGNOSIS — I10 Essential (primary) hypertension: Secondary | ICD-10-CM

## 2017-05-09 DIAGNOSIS — I25118 Atherosclerotic heart disease of native coronary artery with other forms of angina pectoris: Secondary | ICD-10-CM

## 2017-05-09 DIAGNOSIS — E785 Hyperlipidemia, unspecified: Secondary | ICD-10-CM | POA: Diagnosis not present

## 2017-05-09 DIAGNOSIS — Z951 Presence of aortocoronary bypass graft: Secondary | ICD-10-CM

## 2017-05-09 DIAGNOSIS — R0602 Shortness of breath: Secondary | ICD-10-CM

## 2017-05-09 MED ORDER — FUROSEMIDE 20 MG PO TABS: 20.0000 mg | ORAL_TABLET | ORAL | 6 refills | Status: DC

## 2017-05-09 MED ORDER — NITROGLYCERIN 0.4 MG SL SUBL: 0.4000 mg | SUBLINGUAL_TABLET | SUBLINGUAL | 11 refills | Status: DC | PRN

## 2017-05-09 NOTE — Patient Instructions (Addendum)
Medication Instructions:  Your physician has recommended you make the following change in your medication:  STOP chlorthalidone START furosemide 20 mg three times weekly   Labwork: Your physician recommends that you return for lab work in: 1 week at Dr. Nyra Capes office. BMP, BNP   Testing/Procedures: You had an EKG today.  Follow-Up: Your physician wants you to follow-up in: 6 months. You will receive a reminder letter in the mail two months in advance. If you don't receive a letter, please call our office to schedule the follow-up appointment.   Any Other Special Instructions Will Be Listed Below (If Applicable).     If you need a refill on your cardiac medications before your next appointment, please call your pharmacy.    Heart Failure  Weigh yourself every morning when you first wake up and record on a calender or note pad, bring this to your office visits. Using a pill tender can help with taking your medications consistently.  Limit your fluid intake to 2 liters daily  Limit your sodium intake to less than 2-3 grams daily. Ask if you need dietary teaching.  If you gain more than 3 pounds (from your dry weight ), double your dose of diuretic for the day.  If you gain more than 5 pounds (from your dry weight), double your dose of lasix and call your heart failure doctor.  Please do not smoke tobacco since it is very bad for your heart.  Please do not drink alcohol since it can worsen your heart failure.Also avoid OTC nonsteroidal drugs, such as advil, aleve and motrin.  Try to exercise for at least 30 minutes every day because this will help your heart be more efficient. You may be eligible for supervised cardiac rehab, ask your physician.

## 2017-05-10 DIAGNOSIS — M810 Age-related osteoporosis without current pathological fracture: Secondary | ICD-10-CM | POA: Diagnosis not present

## 2017-05-10 DIAGNOSIS — I509 Heart failure, unspecified: Secondary | ICD-10-CM | POA: Diagnosis not present

## 2017-05-24 DIAGNOSIS — I509 Heart failure, unspecified: Secondary | ICD-10-CM | POA: Diagnosis not present

## 2017-05-24 DIAGNOSIS — Z23 Encounter for immunization: Secondary | ICD-10-CM | POA: Diagnosis not present

## 2017-05-24 DIAGNOSIS — R0609 Other forms of dyspnea: Secondary | ICD-10-CM | POA: Diagnosis not present

## 2017-06-13 DIAGNOSIS — I129 Hypertensive chronic kidney disease with stage 1 through stage 4 chronic kidney disease, or unspecified chronic kidney disease: Secondary | ICD-10-CM | POA: Diagnosis not present

## 2017-06-13 DIAGNOSIS — I2510 Atherosclerotic heart disease of native coronary artery without angina pectoris: Secondary | ICD-10-CM | POA: Diagnosis not present

## 2017-06-13 DIAGNOSIS — E1122 Type 2 diabetes mellitus with diabetic chronic kidney disease: Secondary | ICD-10-CM | POA: Diagnosis not present

## 2017-06-13 DIAGNOSIS — D638 Anemia in other chronic diseases classified elsewhere: Secondary | ICD-10-CM | POA: Diagnosis not present

## 2017-06-13 DIAGNOSIS — N189 Chronic kidney disease, unspecified: Secondary | ICD-10-CM | POA: Diagnosis not present

## 2017-06-13 DIAGNOSIS — M810 Age-related osteoporosis without current pathological fracture: Secondary | ICD-10-CM | POA: Diagnosis not present

## 2017-06-20 DIAGNOSIS — I517 Cardiomegaly: Secondary | ICD-10-CM | POA: Diagnosis not present

## 2017-06-20 DIAGNOSIS — I081 Rheumatic disorders of both mitral and tricuspid valves: Secondary | ICD-10-CM | POA: Diagnosis not present

## 2017-06-20 DIAGNOSIS — I509 Heart failure, unspecified: Secondary | ICD-10-CM | POA: Diagnosis not present

## 2017-06-20 DIAGNOSIS — R0602 Shortness of breath: Secondary | ICD-10-CM | POA: Diagnosis not present

## 2017-06-20 DIAGNOSIS — R0609 Other forms of dyspnea: Secondary | ICD-10-CM | POA: Diagnosis not present

## 2017-06-29 DIAGNOSIS — E1122 Type 2 diabetes mellitus with diabetic chronic kidney disease: Secondary | ICD-10-CM | POA: Diagnosis not present

## 2017-06-29 DIAGNOSIS — I4891 Unspecified atrial fibrillation: Secondary | ICD-10-CM | POA: Diagnosis not present

## 2017-06-29 DIAGNOSIS — D692 Other nonthrombocytopenic purpura: Secondary | ICD-10-CM | POA: Diagnosis not present

## 2017-06-29 DIAGNOSIS — I2510 Atherosclerotic heart disease of native coronary artery without angina pectoris: Secondary | ICD-10-CM | POA: Diagnosis not present

## 2017-07-09 DIAGNOSIS — N184 Chronic kidney disease, stage 4 (severe): Secondary | ICD-10-CM | POA: Diagnosis not present

## 2017-07-09 DIAGNOSIS — E1159 Type 2 diabetes mellitus with other circulatory complications: Secondary | ICD-10-CM | POA: Diagnosis not present

## 2017-07-09 DIAGNOSIS — E1169 Type 2 diabetes mellitus with other specified complication: Secondary | ICD-10-CM | POA: Diagnosis not present

## 2017-07-12 ENCOUNTER — Other Ambulatory Visit: Payer: Self-pay

## 2017-07-12 MED ORDER — PRAVASTATIN SODIUM 10 MG PO TABS: 10.0000 mg | ORAL_TABLET | Freq: Every day | ORAL | 3 refills | Status: DC

## 2017-07-13 ENCOUNTER — Other Ambulatory Visit: Payer: Self-pay

## 2017-07-13 ENCOUNTER — Telehealth: Payer: Self-pay | Admitting: Cardiology

## 2017-07-13 MED ORDER — PRAVASTATIN SODIUM 10 MG PO TABS: 10.0000 mg | ORAL_TABLET | ORAL | 3 refills | Status: DC

## 2017-07-13 NOTE — Telephone Encounter (Signed)
Has questions about the directions on her prevastation that was called in

## 2017-07-13 NOTE — Telephone Encounter (Signed)
Patient advised to continue pravastatin as she has been, twice weekly. Advised prescription would be fixed at the pharmacy.

## 2017-07-13 NOTE — Telephone Encounter (Signed)
Left message to return call 

## 2017-07-16 DIAGNOSIS — E1159 Type 2 diabetes mellitus with other circulatory complications: Secondary | ICD-10-CM | POA: Diagnosis not present

## 2017-07-16 DIAGNOSIS — Z794 Long term (current) use of insulin: Secondary | ICD-10-CM | POA: Diagnosis not present

## 2017-07-16 DIAGNOSIS — N184 Chronic kidney disease, stage 4 (severe): Secondary | ICD-10-CM | POA: Diagnosis not present

## 2017-07-16 DIAGNOSIS — I1310 Hypertensive heart and chronic kidney disease without heart failure, with stage 1 through stage 4 chronic kidney disease, or unspecified chronic kidney disease: Secondary | ICD-10-CM | POA: Diagnosis not present

## 2017-07-16 DIAGNOSIS — E1169 Type 2 diabetes mellitus with other specified complication: Secondary | ICD-10-CM | POA: Diagnosis not present

## 2017-07-25 DIAGNOSIS — Z794 Long term (current) use of insulin: Secondary | ICD-10-CM | POA: Diagnosis not present

## 2017-07-25 DIAGNOSIS — E1159 Type 2 diabetes mellitus with other circulatory complications: Secondary | ICD-10-CM | POA: Diagnosis not present

## 2017-07-26 DIAGNOSIS — E119 Type 2 diabetes mellitus without complications: Secondary | ICD-10-CM | POA: Diagnosis not present

## 2017-07-26 DIAGNOSIS — E1159 Type 2 diabetes mellitus with other circulatory complications: Secondary | ICD-10-CM | POA: Diagnosis not present

## 2017-07-26 DIAGNOSIS — Z794 Long term (current) use of insulin: Secondary | ICD-10-CM | POA: Diagnosis not present

## 2017-08-23 DIAGNOSIS — E1159 Type 2 diabetes mellitus with other circulatory complications: Secondary | ICD-10-CM | POA: Diagnosis not present

## 2017-08-23 DIAGNOSIS — Z794 Long term (current) use of insulin: Secondary | ICD-10-CM | POA: Diagnosis not present

## 2017-09-03 DIAGNOSIS — D509 Iron deficiency anemia, unspecified: Secondary | ICD-10-CM | POA: Diagnosis not present

## 2017-09-03 DIAGNOSIS — Z6824 Body mass index (BMI) 24.0-24.9, adult: Secondary | ICD-10-CM | POA: Diagnosis not present

## 2017-09-03 DIAGNOSIS — N390 Urinary tract infection, site not specified: Secondary | ICD-10-CM | POA: Diagnosis not present

## 2017-09-21 DIAGNOSIS — D638 Anemia in other chronic diseases classified elsewhere: Secondary | ICD-10-CM | POA: Diagnosis not present

## 2017-09-21 DIAGNOSIS — I2510 Atherosclerotic heart disease of native coronary artery without angina pectoris: Secondary | ICD-10-CM | POA: Diagnosis not present

## 2017-09-21 DIAGNOSIS — D631 Anemia in chronic kidney disease: Secondary | ICD-10-CM | POA: Diagnosis not present

## 2017-09-21 DIAGNOSIS — I10 Essential (primary) hypertension: Secondary | ICD-10-CM | POA: Diagnosis not present

## 2017-09-21 DIAGNOSIS — D509 Iron deficiency anemia, unspecified: Secondary | ICD-10-CM | POA: Diagnosis not present

## 2017-09-21 DIAGNOSIS — N189 Chronic kidney disease, unspecified: Secondary | ICD-10-CM | POA: Diagnosis not present

## 2017-09-21 DIAGNOSIS — E119 Type 2 diabetes mellitus without complications: Secondary | ICD-10-CM | POA: Diagnosis not present

## 2017-09-28 DIAGNOSIS — D638 Anemia in other chronic diseases classified elsewhere: Secondary | ICD-10-CM | POA: Diagnosis not present

## 2017-09-28 DIAGNOSIS — R0600 Dyspnea, unspecified: Secondary | ICD-10-CM | POA: Diagnosis not present

## 2017-09-28 DIAGNOSIS — E119 Type 2 diabetes mellitus without complications: Secondary | ICD-10-CM | POA: Diagnosis not present

## 2017-09-28 DIAGNOSIS — E1159 Type 2 diabetes mellitus with other circulatory complications: Secondary | ICD-10-CM | POA: Diagnosis not present

## 2017-09-28 DIAGNOSIS — N390 Urinary tract infection, site not specified: Secondary | ICD-10-CM | POA: Diagnosis not present

## 2017-09-28 DIAGNOSIS — R609 Edema, unspecified: Secondary | ICD-10-CM | POA: Diagnosis not present

## 2017-09-28 DIAGNOSIS — Z794 Long term (current) use of insulin: Secondary | ICD-10-CM | POA: Diagnosis not present

## 2017-10-04 DIAGNOSIS — Z789 Other specified health status: Secondary | ICD-10-CM | POA: Diagnosis not present

## 2017-10-04 DIAGNOSIS — E1159 Type 2 diabetes mellitus with other circulatory complications: Secondary | ICD-10-CM | POA: Diagnosis not present

## 2017-10-04 DIAGNOSIS — Z794 Long term (current) use of insulin: Secondary | ICD-10-CM | POA: Diagnosis not present

## 2017-10-05 DIAGNOSIS — D638 Anemia in other chronic diseases classified elsewhere: Secondary | ICD-10-CM | POA: Diagnosis not present

## 2017-10-05 DIAGNOSIS — E119 Type 2 diabetes mellitus without complications: Secondary | ICD-10-CM | POA: Diagnosis not present

## 2017-10-12 DIAGNOSIS — D638 Anemia in other chronic diseases classified elsewhere: Secondary | ICD-10-CM | POA: Diagnosis not present

## 2017-10-12 DIAGNOSIS — E119 Type 2 diabetes mellitus without complications: Secondary | ICD-10-CM | POA: Diagnosis not present

## 2017-10-19 DIAGNOSIS — E119 Type 2 diabetes mellitus without complications: Secondary | ICD-10-CM | POA: Diagnosis not present

## 2017-10-19 DIAGNOSIS — D638 Anemia in other chronic diseases classified elsewhere: Secondary | ICD-10-CM | POA: Diagnosis not present

## 2017-10-24 DIAGNOSIS — N189 Chronic kidney disease, unspecified: Secondary | ICD-10-CM | POA: Diagnosis not present

## 2017-10-24 DIAGNOSIS — D631 Anemia in chronic kidney disease: Secondary | ICD-10-CM | POA: Diagnosis not present

## 2017-10-24 DIAGNOSIS — D638 Anemia in other chronic diseases classified elsewhere: Secondary | ICD-10-CM | POA: Diagnosis not present

## 2017-10-24 DIAGNOSIS — I129 Hypertensive chronic kidney disease with stage 1 through stage 4 chronic kidney disease, or unspecified chronic kidney disease: Secondary | ICD-10-CM | POA: Diagnosis not present

## 2017-10-24 DIAGNOSIS — I2510 Atherosclerotic heart disease of native coronary artery without angina pectoris: Secondary | ICD-10-CM | POA: Diagnosis not present

## 2017-10-24 DIAGNOSIS — E1122 Type 2 diabetes mellitus with diabetic chronic kidney disease: Secondary | ICD-10-CM | POA: Diagnosis not present

## 2017-10-26 DIAGNOSIS — E119 Type 2 diabetes mellitus without complications: Secondary | ICD-10-CM | POA: Diagnosis not present

## 2017-10-26 DIAGNOSIS — D638 Anemia in other chronic diseases classified elsewhere: Secondary | ICD-10-CM | POA: Diagnosis not present

## 2017-10-31 ENCOUNTER — Other Ambulatory Visit: Payer: Self-pay

## 2017-10-31 DIAGNOSIS — R0602 Shortness of breath: Secondary | ICD-10-CM

## 2017-10-31 MED ORDER — FUROSEMIDE 20 MG PO TABS: 20.0000 mg | ORAL_TABLET | ORAL | 1 refills | Status: DC

## 2017-11-01 ENCOUNTER — Other Ambulatory Visit: Payer: Self-pay

## 2017-11-01 DIAGNOSIS — R0602 Shortness of breath: Secondary | ICD-10-CM

## 2017-11-01 MED ORDER — FUROSEMIDE 20 MG PO TABS: 20.0000 mg | ORAL_TABLET | ORAL | 1 refills | Status: DC

## 2017-11-02 DIAGNOSIS — D638 Anemia in other chronic diseases classified elsewhere: Secondary | ICD-10-CM | POA: Diagnosis not present

## 2017-11-02 DIAGNOSIS — E119 Type 2 diabetes mellitus without complications: Secondary | ICD-10-CM | POA: Diagnosis not present

## 2017-11-14 DIAGNOSIS — E1169 Type 2 diabetes mellitus with other specified complication: Secondary | ICD-10-CM | POA: Diagnosis not present

## 2017-11-14 DIAGNOSIS — E1159 Type 2 diabetes mellitus with other circulatory complications: Secondary | ICD-10-CM | POA: Diagnosis not present

## 2017-11-14 DIAGNOSIS — N184 Chronic kidney disease, stage 4 (severe): Secondary | ICD-10-CM | POA: Diagnosis not present

## 2017-11-15 ENCOUNTER — Encounter: Payer: Self-pay | Admitting: Cardiology

## 2017-11-15 ENCOUNTER — Ambulatory Visit (INDEPENDENT_AMBULATORY_CARE_PROVIDER_SITE_OTHER): Payer: PPO | Admitting: Cardiology

## 2017-11-15 VITALS — BP 134/68 | HR 59 | Ht 62.0 in | Wt 130.0 lb

## 2017-11-15 DIAGNOSIS — I25118 Atherosclerotic heart disease of native coronary artery with other forms of angina pectoris: Secondary | ICD-10-CM | POA: Diagnosis not present

## 2017-11-15 DIAGNOSIS — I110 Hypertensive heart disease with heart failure: Secondary | ICD-10-CM

## 2017-11-15 DIAGNOSIS — E785 Hyperlipidemia, unspecified: Secondary | ICD-10-CM | POA: Diagnosis not present

## 2017-11-15 DIAGNOSIS — D649 Anemia, unspecified: Secondary | ICD-10-CM | POA: Diagnosis not present

## 2017-11-15 MED ORDER — NITROGLYCERIN 0.4 MG SL SUBL: 0.4000 mg | SUBLINGUAL_TABLET | SUBLINGUAL | 11 refills | Status: DC | PRN

## 2017-11-15 NOTE — Progress Notes (Signed)
Cardiology Office Note:    Date:  11/15/2017   ID:  Regina Herrera, DOB 01-19-1927, MRN [id number]  PCP:  Marco Collie, MD  Cardiologist:  Shirlee More, MD    Referring MD: Marco Collie, MD    History of Present Illness:    Regina Herrera is a 84 y.o. female with a hx of CAD, Dyslipidemia, HTN, S/P CABG,  03/13/17 S/P PCI  of her native RCA with Cypher DES 10/20/02  last seen 6 months ago.  ASSESSMENT:    1. Coronary artery disease of native artery of native heart with stable angina pectoris ([place])   2. Essential hypertension   3. Hyperlipidemia, unspecified hyperlipidemia type   4. Hx of CABG   5. SOB (shortness of breath) on exertion    PLAN:     1.        Stable after both remote surgical and PCI revascularization I would continue her current medical treatment including aspirin and calcium channel blocker and lipid-lowering treatment at this time I don't think she requires an ischemia evaluation. 1. Stable blood pressure target continue current treatment 2. Stable continue combined treatment with very low-dose low intensity statin and Zetia and gemfibrozil 3. Stable 4. Clinically suspect element of diastolic heart failure with edema and exertional dyspnea I will withdraw her thiazide diuretic started on a minimum dose of loop diuretic and ask her in one week to go to her PCP office to have BMP and BNP performed. If unimproved then echocardiogram or even consideration of myocardial perfusion imaging would be warranted  Compliance with diet, lifestyle and medications: Yes Past Medical History:  Diagnosis Date  . Anemia 05/08/2017  . CKD (chronic kidney disease) 05/08/2017  . Coronary artery disease involving native coronary artery of native heart 06/09/2015   Overview:   . DM (diabetes mellitus) ([place]) 05/08/2017  . Essential hypertension 06/09/2015  . Hyperlipidemia 06/09/2015   Overview:  poor statin tolerance  . Iron deficiency anemia 06/10/2015  . LVH (left ventricular  hypertrophy) 06/10/2015  . PAF (paroxysmal atrial fibrillation) ([place]) 05/08/2017  . Sinus bradycardia 05/08/2017  . Syncope 05/08/2017    Past Surgical History:  Procedure Laterality Date  . CARDIAC CATHETERIZATION    . CORONARY ANGIOPLASTY    . CORONARY ARTERY BYPASS GRAFT      Current Medications: Current Meds  Medication Sig  . amLODipine (NORVASC) 10 MG tablet Take 10 mg by mouth daily.  . Ascorbic Acid (VITAMIN C) 100 MG tablet Take 100 mg by mouth daily.  Marland Kitchen aspirin 81 MG chewable tablet Chew 81 mg by mouth daily.  . cetirizine (ZYRTEC) 10 MG tablet Take 10 mg by mouth daily.  . chlorthalidone (HYGROTON) 25 MG tablet Take 12.5 mg by mouth daily.  Marland Kitchen denosumab (PROLIA) 60 MG/ML SOLN injection Inject 1 Dose into the skin every 6 (six) months.  . ezetimibe (ZETIA) 10 MG tablet Take 10 mg by mouth daily.  . Ferrous Fumarate (HEMOCYTE - 106 MG FE) 324 (106 Fe) MG TABS tablet Take 324 mg of iron by mouth daily.  . furosemide (LASIX) 20 MG tablet Take 1 tablet (20 mg total) by mouth 3 (three) times a week.  Marland Kitchen gemfibrozil (LOPID) 600 MG tablet Take 600 mg by mouth 2 (two) times daily.  Marland Kitchen glimepiride (AMARYL) 1 MG tablet Take 0.5 mg by mouth daily.  . Insulin Detemir (LEVEMIR FLEXTOUCH) 100 UNIT/ML Pen Inject 10 Units into the skin daily.  . isosorbide mononitrate (IMDUR) 30 MG 24 hr tablet  Take 30 mg by mouth 3 (three) times daily.  . meclizine (ANTIVERT) 12.5 MG tablet Take 1 tablet by mouth every 8 (eight) hours as needed for dizziness.  . nitroGLYCERIN (NITROSTAT) 0.4 MG SL tablet Place 1 tablet (0.4 mg total) under the tongue every 5 (five) minutes as needed for chest pain.  . Omega-3 Fatty Acids (FISH OIL) 1000 MG CAPS Take 1 capsule by mouth 2 (two) times daily.  . pantoprazole (PROTONIX) 40 MG tablet Take 40 mg by mouth daily.  . pravastatin (PRAVACHOL) 10 MG tablet Take 1 tablet (10 mg total) by mouth 2 (two) times a week.     Allergies:   Codeine   Social History    Socioeconomic History  . Marital status: Widowed    Spouse name: None  . Number of children: None  . Years of education: None  . Highest education level: None  Social Needs  . Financial resource strain: None  . Food insecurity - worry: None  . Food insecurity - inability: None  . Transportation needs - medical: None  . Transportation needs - non-medical: None  Occupational History  . None  Tobacco Use  . Smoking status: Never Smoker  . Smokeless tobacco: Never Used  Substance and Sexual Activity  . Alcohol use: No  . Drug use: No  . Sexual activity: None  Other Topics Concern  . None  Social History Narrative  . None     Family History: The patient's family history includes Cancer in her brother; Heart attack in her father; Heart disease in her brother and sister; Stroke in her mother. ROS:   Please see the history of present illness.    All other systems reviewed and are negative.  EKGs/Labs/Other Studies Reviewed:    The following studies were reviewed today:   Recent Labs: No results found for requested labs within last 8760 hours.  Recent Lipid Panel No results found for: CHOL, TRIG, HDL, CHOLHDL, VLDL, LDLCALC, LDLDIRECT  Physical Exam:    VS:  BP 134/68 (BP Location: Right Arm, Patient Position: Sitting, Cuff Size: Normal)   Pulse (!) 59   Ht 5\' 2"  (1.575 m)   Wt 130 lb (59 kg)   SpO2 98%   BMI 23.78 kg/m     Wt Readings from Last 3 Encounters:  11/15/17 130 lb (59 kg)  05/09/17 132 lb 12.8 oz (60.2 kg)     GEN:  Well nourished, well developed in no acute distress HEENT: Normal NECK: No JVD; No carotid bruits LYMPHATICS: No lymphadenopathy CARDIAC: RRR, no murmurs, rubs, gallops RESPIRATORY:  Clear to auscultation without rales, wheezing or rhonchi  ABDOMEN: Soft, non-tender, non-distended MUSCULOSKELETAL:  No edema; No deformity  SKIN: Warm and dry NEUROLOGIC:  Alert and oriented x 3 PSYCHIATRIC:  Normal affect    Signed, Shirlee More, MD  11/15/2017 3:37 PM    [facility]

## 2017-11-15 NOTE — Patient Instructions (Signed)

## 2017-11-16 DIAGNOSIS — D638 Anemia in other chronic diseases classified elsewhere: Secondary | ICD-10-CM | POA: Diagnosis not present

## 2017-11-16 DIAGNOSIS — E119 Type 2 diabetes mellitus without complications: Secondary | ICD-10-CM | POA: Diagnosis not present

## 2017-11-19 DIAGNOSIS — Z794 Long term (current) use of insulin: Secondary | ICD-10-CM | POA: Diagnosis not present

## 2017-11-19 DIAGNOSIS — E119 Type 2 diabetes mellitus without complications: Secondary | ICD-10-CM | POA: Diagnosis not present

## 2017-11-19 DIAGNOSIS — E1159 Type 2 diabetes mellitus with other circulatory complications: Secondary | ICD-10-CM | POA: Diagnosis not present

## 2017-11-19 DIAGNOSIS — Z789 Other specified health status: Secondary | ICD-10-CM | POA: Diagnosis not present

## 2017-11-21 DIAGNOSIS — M810 Age-related osteoporosis without current pathological fracture: Secondary | ICD-10-CM | POA: Diagnosis not present

## 2017-11-22 ENCOUNTER — Other Ambulatory Visit: Payer: Self-pay

## 2017-11-22 DIAGNOSIS — Z0000 Encounter for general adult medical examination without abnormal findings: Secondary | ICD-10-CM | POA: Diagnosis not present

## 2017-11-22 DIAGNOSIS — E1122 Type 2 diabetes mellitus with diabetic chronic kidney disease: Secondary | ICD-10-CM | POA: Diagnosis not present

## 2017-11-22 DIAGNOSIS — E119 Type 2 diabetes mellitus without complications: Secondary | ICD-10-CM | POA: Diagnosis not present

## 2017-11-22 DIAGNOSIS — Z1331 Encounter for screening for depression: Secondary | ICD-10-CM | POA: Diagnosis not present

## 2017-11-22 DIAGNOSIS — R0602 Shortness of breath: Secondary | ICD-10-CM

## 2017-11-22 DIAGNOSIS — Z794 Long term (current) use of insulin: Secondary | ICD-10-CM | POA: Diagnosis not present

## 2017-11-22 DIAGNOSIS — Z139 Encounter for screening, unspecified: Secondary | ICD-10-CM | POA: Diagnosis not present

## 2017-11-22 DIAGNOSIS — E1159 Type 2 diabetes mellitus with other circulatory complications: Secondary | ICD-10-CM | POA: Diagnosis not present

## 2017-11-22 DIAGNOSIS — N183 Chronic kidney disease, stage 3 (moderate): Secondary | ICD-10-CM | POA: Diagnosis not present

## 2017-11-22 MED ORDER — FUROSEMIDE 20 MG PO TABS: 20.0000 mg | ORAL_TABLET | ORAL | 3 refills | Status: DC

## 2017-11-30 DIAGNOSIS — D638 Anemia in other chronic diseases classified elsewhere: Secondary | ICD-10-CM | POA: Diagnosis not present

## 2017-11-30 DIAGNOSIS — E119 Type 2 diabetes mellitus without complications: Secondary | ICD-10-CM | POA: Diagnosis not present

## 2017-12-03 DIAGNOSIS — R531 Weakness: Secondary | ICD-10-CM | POA: Diagnosis not present

## 2017-12-03 DIAGNOSIS — D649 Anemia, unspecified: Secondary | ICD-10-CM | POA: Diagnosis not present

## 2017-12-03 DIAGNOSIS — R55 Syncope and collapse: Secondary | ICD-10-CM | POA: Diagnosis not present

## 2017-12-03 DIAGNOSIS — I480 Paroxysmal atrial fibrillation: Secondary | ICD-10-CM | POA: Diagnosis not present

## 2017-12-03 DIAGNOSIS — E1165 Type 2 diabetes mellitus with hyperglycemia: Secondary | ICD-10-CM | POA: Diagnosis not present

## 2017-12-03 DIAGNOSIS — R404 Transient alteration of awareness: Secondary | ICD-10-CM | POA: Diagnosis not present

## 2017-12-03 DIAGNOSIS — I2510 Atherosclerotic heart disease of native coronary artery without angina pectoris: Secondary | ICD-10-CM | POA: Diagnosis not present

## 2017-12-03 DIAGNOSIS — R079 Chest pain, unspecified: Secondary | ICD-10-CM | POA: Diagnosis not present

## 2017-12-03 DIAGNOSIS — E785 Hyperlipidemia, unspecified: Secondary | ICD-10-CM | POA: Diagnosis not present

## 2017-12-03 DIAGNOSIS — R0602 Shortness of breath: Secondary | ICD-10-CM | POA: Diagnosis not present

## 2017-12-04 DIAGNOSIS — E1165 Type 2 diabetes mellitus with hyperglycemia: Secondary | ICD-10-CM | POA: Diagnosis not present

## 2017-12-04 DIAGNOSIS — R0602 Shortness of breath: Secondary | ICD-10-CM | POA: Diagnosis not present

## 2017-12-04 DIAGNOSIS — I2510 Atherosclerotic heart disease of native coronary artery without angina pectoris: Secondary | ICD-10-CM | POA: Diagnosis not present

## 2017-12-04 DIAGNOSIS — D649 Anemia, unspecified: Secondary | ICD-10-CM | POA: Diagnosis not present

## 2017-12-04 DIAGNOSIS — R079 Chest pain, unspecified: Secondary | ICD-10-CM | POA: Diagnosis not present

## 2017-12-07 DIAGNOSIS — R0789 Other chest pain: Secondary | ICD-10-CM | POA: Diagnosis not present

## 2017-12-08 DIAGNOSIS — R739 Hyperglycemia, unspecified: Secondary | ICD-10-CM | POA: Diagnosis not present

## 2017-12-09 DIAGNOSIS — Z7982 Long term (current) use of aspirin: Secondary | ICD-10-CM | POA: Diagnosis not present

## 2017-12-09 DIAGNOSIS — E1122 Type 2 diabetes mellitus with diabetic chronic kidney disease: Secondary | ICD-10-CM | POA: Diagnosis not present

## 2017-12-09 DIAGNOSIS — Z79899 Other long term (current) drug therapy: Secondary | ICD-10-CM | POA: Diagnosis not present

## 2017-12-09 DIAGNOSIS — Z79891 Long term (current) use of opiate analgesic: Secondary | ICD-10-CM | POA: Diagnosis not present

## 2017-12-09 DIAGNOSIS — N3000 Acute cystitis without hematuria: Secondary | ICD-10-CM | POA: Diagnosis not present

## 2017-12-09 DIAGNOSIS — D649 Anemia, unspecified: Secondary | ICD-10-CM | POA: Diagnosis not present

## 2017-12-09 DIAGNOSIS — Z7984 Long term (current) use of oral hypoglycemic drugs: Secondary | ICD-10-CM | POA: Diagnosis not present

## 2017-12-09 DIAGNOSIS — M19041 Primary osteoarthritis, right hand: Secondary | ICD-10-CM | POA: Diagnosis not present

## 2017-12-09 DIAGNOSIS — Z951 Presence of aortocoronary bypass graft: Secondary | ICD-10-CM | POA: Diagnosis not present

## 2017-12-09 DIAGNOSIS — I5033 Acute on chronic diastolic (congestive) heart failure: Secondary | ICD-10-CM | POA: Diagnosis not present

## 2017-12-09 DIAGNOSIS — I110 Hypertensive heart disease with heart failure: Secondary | ICD-10-CM | POA: Diagnosis not present

## 2017-12-09 DIAGNOSIS — E1165 Type 2 diabetes mellitus with hyperglycemia: Secondary | ICD-10-CM | POA: Diagnosis not present

## 2017-12-09 DIAGNOSIS — N289 Disorder of kidney and ureter, unspecified: Secondary | ICD-10-CM | POA: Diagnosis not present

## 2017-12-09 DIAGNOSIS — I2510 Atherosclerotic heart disease of native coronary artery without angina pectoris: Secondary | ICD-10-CM | POA: Diagnosis not present

## 2017-12-09 DIAGNOSIS — E785 Hyperlipidemia, unspecified: Secondary | ICD-10-CM | POA: Diagnosis not present

## 2017-12-09 DIAGNOSIS — I509 Heart failure, unspecified: Secondary | ICD-10-CM | POA: Diagnosis not present

## 2017-12-09 DIAGNOSIS — Z792 Long term (current) use of antibiotics: Secondary | ICD-10-CM | POA: Diagnosis not present

## 2017-12-09 DIAGNOSIS — N185 Chronic kidney disease, stage 5: Secondary | ICD-10-CM | POA: Diagnosis not present

## 2017-12-09 DIAGNOSIS — R531 Weakness: Secondary | ICD-10-CM | POA: Diagnosis not present

## 2017-12-09 DIAGNOSIS — N179 Acute kidney failure, unspecified: Secondary | ICD-10-CM | POA: Diagnosis not present

## 2017-12-09 DIAGNOSIS — I132 Hypertensive heart and chronic kidney disease with heart failure and with stage 5 chronic kidney disease, or end stage renal disease: Secondary | ICD-10-CM | POA: Diagnosis not present

## 2017-12-09 DIAGNOSIS — M19042 Primary osteoarthritis, left hand: Secondary | ICD-10-CM | POA: Diagnosis not present

## 2017-12-09 DIAGNOSIS — Z87891 Personal history of nicotine dependence: Secondary | ICD-10-CM | POA: Diagnosis not present

## 2017-12-09 DIAGNOSIS — I495 Sick sinus syndrome: Secondary | ICD-10-CM | POA: Diagnosis not present

## 2017-12-09 DIAGNOSIS — R0602 Shortness of breath: Secondary | ICD-10-CM | POA: Diagnosis not present

## 2017-12-09 DIAGNOSIS — Z794 Long term (current) use of insulin: Secondary | ICD-10-CM | POA: Diagnosis not present

## 2017-12-09 DIAGNOSIS — J90 Pleural effusion, not elsewhere classified: Secondary | ICD-10-CM | POA: Diagnosis not present

## 2017-12-09 DIAGNOSIS — D509 Iron deficiency anemia, unspecified: Secondary | ICD-10-CM | POA: Diagnosis not present

## 2017-12-10 DIAGNOSIS — N179 Acute kidney failure, unspecified: Secondary | ICD-10-CM | POA: Diagnosis not present

## 2017-12-10 DIAGNOSIS — I10 Essential (primary) hypertension: Secondary | ICD-10-CM | POA: Diagnosis not present

## 2017-12-10 DIAGNOSIS — Z792 Long term (current) use of antibiotics: Secondary | ICD-10-CM | POA: Diagnosis not present

## 2017-12-10 DIAGNOSIS — R531 Weakness: Secondary | ICD-10-CM | POA: Diagnosis not present

## 2017-12-10 DIAGNOSIS — E785 Hyperlipidemia, unspecified: Secondary | ICD-10-CM | POA: Diagnosis not present

## 2017-12-10 DIAGNOSIS — I5031 Acute diastolic (congestive) heart failure: Secondary | ICD-10-CM | POA: Diagnosis not present

## 2017-12-10 DIAGNOSIS — I495 Sick sinus syndrome: Secondary | ICD-10-CM | POA: Diagnosis not present

## 2017-12-10 DIAGNOSIS — N185 Chronic kidney disease, stage 5: Secondary | ICD-10-CM | POA: Diagnosis not present

## 2017-12-10 DIAGNOSIS — E1122 Type 2 diabetes mellitus with diabetic chronic kidney disease: Secondary | ICD-10-CM | POA: Diagnosis not present

## 2017-12-10 DIAGNOSIS — I132 Hypertensive heart and chronic kidney disease with heart failure and with stage 5 chronic kidney disease, or end stage renal disease: Secondary | ICD-10-CM | POA: Diagnosis not present

## 2017-12-10 DIAGNOSIS — I2510 Atherosclerotic heart disease of native coronary artery without angina pectoris: Secondary | ICD-10-CM | POA: Diagnosis not present

## 2017-12-11 ENCOUNTER — Other Ambulatory Visit: Payer: Self-pay

## 2017-12-11 DIAGNOSIS — I5033 Acute on chronic diastolic (congestive) heart failure: Secondary | ICD-10-CM | POA: Diagnosis not present

## 2017-12-11 DIAGNOSIS — E1122 Type 2 diabetes mellitus with diabetic chronic kidney disease: Secondary | ICD-10-CM | POA: Diagnosis not present

## 2017-12-11 DIAGNOSIS — N184 Chronic kidney disease, stage 4 (severe): Secondary | ICD-10-CM | POA: Diagnosis not present

## 2017-12-11 DIAGNOSIS — I2510 Atherosclerotic heart disease of native coronary artery without angina pectoris: Secondary | ICD-10-CM | POA: Diagnosis not present

## 2017-12-11 DIAGNOSIS — Z794 Long term (current) use of insulin: Secondary | ICD-10-CM | POA: Diagnosis not present

## 2017-12-11 DIAGNOSIS — Z951 Presence of aortocoronary bypass graft: Secondary | ICD-10-CM | POA: Diagnosis not present

## 2017-12-11 DIAGNOSIS — K219 Gastro-esophageal reflux disease without esophagitis: Secondary | ICD-10-CM | POA: Diagnosis not present

## 2017-12-11 DIAGNOSIS — D509 Iron deficiency anemia, unspecified: Secondary | ICD-10-CM | POA: Diagnosis not present

## 2017-12-11 DIAGNOSIS — I130 Hypertensive heart and chronic kidney disease with heart failure and stage 1 through stage 4 chronic kidney disease, or unspecified chronic kidney disease: Secondary | ICD-10-CM | POA: Diagnosis not present

## 2017-12-11 DIAGNOSIS — Z7982 Long term (current) use of aspirin: Secondary | ICD-10-CM | POA: Diagnosis not present

## 2017-12-11 NOTE — Patient Outreach (Signed)
[facility]) Care Management  12/11/2017  ANAIAH MCMANNIS February 24, 1927 [id number]   [employer] referral received 12/10/17: discharged from [facility] on 12/04/17. RNCM called for transition of care. No answer/busy.  Plan: RNCM will follow up call tomorrow.  [name], RN, MSN, [place] Coordinator Cell: [phone]

## 2017-12-12 ENCOUNTER — Other Ambulatory Visit: Payer: Self-pay | Admitting: *Deleted

## 2017-12-12 ENCOUNTER — Ambulatory Visit: Payer: Self-pay

## 2017-12-12 DIAGNOSIS — E119 Type 2 diabetes mellitus without complications: Secondary | ICD-10-CM | POA: Diagnosis not present

## 2017-12-12 DIAGNOSIS — D638 Anemia in other chronic diseases classified elsewhere: Secondary | ICD-10-CM | POA: Diagnosis not present

## 2017-12-12 NOTE — Patient Outreach (Signed)
[facility]) Care Management  12/12/2017  TAMETRIA AHO 1927-08-26 [id number]    Covering for [name], RN  Transition of care   RN spoke with pt today and introduced the [facility] services and purpose for today's call. Pt identifiers confirmed and pt expressed her current issues and Darron Doom is involved with PT/RN services who will have their next visit on tomorrow. Pt states her issues is currently related to anemia and her HF has resurfaced with LLE edema due to the fluid retention.  Reports receiving shots for her ongoing anemia at [facility] and will follow up with her primary provider on Friday (post-op hospital appointment).  RN offered to review all her medications via discharge sheet however pt states she has reviewed this information with her cardiologist and decline to review with this RN case manager today. RN inquired on pt's HF as pt states she has also had swelling to her LL but more due to this last admission. Pt feels she does not need [facility] due to the [place] involvement but did not have time for RN to explain the difference between [facility] and Case management services with [facility]. Pt receptive but indicated she was "short winded today" and requested a call back on Monday for detail. RN will follow up with this pt on Monday concerning [facility] services and offered possible home visits if needed along with transition of care calls over the next few weeks if allowed. Will discuss a plan of care/goal/interventions at that time with attempt to complete an initial assessment if pt willing to participate in the [facility] program and services.   Raina Mina, RN Care Management Coordinator [place] Office [phone]

## 2017-12-14 DIAGNOSIS — N184 Chronic kidney disease, stage 4 (severe): Secondary | ICD-10-CM | POA: Diagnosis not present

## 2017-12-14 DIAGNOSIS — Z6823 Body mass index (BMI) 23.0-23.9, adult: Secondary | ICD-10-CM | POA: Diagnosis not present

## 2017-12-14 DIAGNOSIS — I5030 Unspecified diastolic (congestive) heart failure: Secondary | ICD-10-CM | POA: Diagnosis not present

## 2017-12-14 DIAGNOSIS — D631 Anemia in chronic kidney disease: Secondary | ICD-10-CM | POA: Diagnosis not present

## 2017-12-17 ENCOUNTER — Other Ambulatory Visit: Payer: Self-pay | Admitting: *Deleted

## 2017-12-17 DIAGNOSIS — I5030 Unspecified diastolic (congestive) heart failure: Secondary | ICD-10-CM | POA: Diagnosis not present

## 2017-12-17 DIAGNOSIS — Z6823 Body mass index (BMI) 23.0-23.9, adult: Secondary | ICD-10-CM | POA: Diagnosis not present

## 2017-12-17 NOTE — Patient Outreach (Signed)
[facility] Care Management  12/17/2017  Regina Herrera August 15, 1927 [id number]  Coverage for [name] Transition of care (unsuccessful)   RN attempted to completed [facility] services and purpose for today call on a short conversation last week and pt requested a call back today what was unsuccessful. RN at this time will send outreach letter to pt and allow a response over the next 10 das prior to initiating another call back.   Raina Mina, RN Care Management Coordinator [place] Office [phone]

## 2017-12-18 ENCOUNTER — Encounter: Payer: Self-pay | Admitting: *Deleted

## 2017-12-20 ENCOUNTER — Ambulatory Visit (INDEPENDENT_AMBULATORY_CARE_PROVIDER_SITE_OTHER): Payer: PPO | Admitting: Cardiology

## 2017-12-20 ENCOUNTER — Encounter: Payer: Self-pay | Admitting: Cardiology

## 2017-12-20 VITALS — BP 120/50 | HR 62 | Ht 62.0 in | Wt 132.0 lb

## 2017-12-20 DIAGNOSIS — I110 Hypertensive heart disease with heart failure: Secondary | ICD-10-CM

## 2017-12-20 DIAGNOSIS — I5032 Chronic diastolic (congestive) heart failure: Secondary | ICD-10-CM

## 2017-12-20 DIAGNOSIS — N183 Chronic kidney disease, stage 3 unspecified: Secondary | ICD-10-CM

## 2017-12-20 DIAGNOSIS — D649 Anemia, unspecified: Secondary | ICD-10-CM

## 2017-12-20 MED ORDER — TORSEMIDE 20 MG PO TABS: 20.0000 mg | ORAL_TABLET | Freq: Every day | ORAL | 11 refills | Status: DC

## 2017-12-20 NOTE — Patient Instructions (Signed)
Medication Instructions:  Your physician has recommended you make the following change in your medication:  STOP furosemide START torsemide 20 mg twice daily  Labwork: None  Testing/Procedures: None  Follow-Up: Your physician recommends that you schedule a follow-up appointment in: 4 weeks.  Any Other Special Instructions Will Be Listed Below (If Applicable).     If you need a refill on your cardiac medications before your next appointment, please call your pharmacy.

## 2017-12-20 NOTE — Progress Notes (Signed)
Cardiology Office Note:    Date:  12/20/2017   ID:  Regina Herrera, DOB 10-Dec-1926, MRN [id number]  PCP:  Marco Collie, MD  Cardiologist:  Shirlee More, MD    Referring MD: Marco Collie, MD    ASSESSMENT:    1. Hypertensive heart disease with heart failure ([place])   2. Chronic diastolic heart failure ([place])   3. Anemia, unspecified type   4. Stage 3 chronic kidney disease ([place])    PLAN:    In order of problems listed above:  1. Worsened heart failure I think she has become resistant to furosemide I will switch her to torsemide and try to get her back to her previous.dry weight in the range of 119 120 pounds.  Mccormick avidity of anemia is a real problem here and that these patients do poorly especially if iron deficient and please that she will see hematology tomorrow and if iron deficient would benefit from parenteral iron therapy.   2. See above 3. Managed by hematology being seen tomorrow will require additional therapy Procrit or iron 4. Followed by her PCP   Next appointment: One month   Medication Adjustments/Labs and Tests Ordered: Current medicines are reviewed at length with the patient today.  Concerns regarding medicines are outlined above.  No orders of the defined types were placed in this encounter.  Meds ordered this encounter  Medications  . torsemide (DEMADEX) 20 MG tablet    Sig: Take 1 tablet (20 mg total) by mouth daily.    Dispense:  60 tablet    Refill:  11    Chief Complaint  Patient presents with  . Hospitalization Follow-up  . Congestive Heart Failure    History of Present Illness:    Regina Herrera is a 84 y.o. female with a hx of CAD, Dyslipidemia, HTN, S/P CABG, 03/13/17 S/P PCIof her native RCA with Cypher DES 10/20/02 last seen 11/15/17. Compliance with diet, lifestyle and medications: yes She has had 2 recent [facility] admissions with decompensated heart failure.  She continues to feel badly her weight is up from his baseline of 119 in the  range of 10 pounds has peripheral edema and tells me 1 heart failure vest has been applied that her number has had exceeded 40 at times.  Recent evaluation in the hospital should decompensated heart failure elevated BNP level bilateral pleural effusions or radiographic heart failure.  She is being seen by hematology tomorrow that she has ongoing anemia hemoglobin 9.6 with iron deficiency and chronic disease related to CKD she has had no chest pain palpitation or syncope. Past Medical History:  Diagnosis Date  . Anemia 05/08/2017  . CKD (chronic kidney disease) 05/08/2017  . Coronary artery disease involving native coronary artery of native heart 06/09/2015   Overview:   . DM (diabetes mellitus) ([place]) 05/08/2017  . Essential hypertension 06/09/2015  . Hyperlipidemia 06/09/2015   Overview:  poor statin tolerance  . Iron deficiency anemia 06/10/2015  . LVH (left ventricular hypertrophy) 06/10/2015  . PAF (paroxysmal atrial fibrillation) ([place]) 05/08/2017  . Sinus bradycardia 05/08/2017  . Syncope 05/08/2017    Past Surgical History:  Procedure Laterality Date  . CARDIAC CATHETERIZATION    . CORONARY ANGIOPLASTY    . CORONARY ARTERY BYPASS GRAFT      Current Medications: Current Meds  Medication Sig  . amLODipine (NORVASC) 10 MG tablet Take 10 mg by mouth daily.  . Ascorbic Acid (VITAMIN C) 100 MG tablet Take 100 mg by mouth daily.  Marland Kitchen  aspirin 81 MG chewable tablet Chew 81 mg by mouth daily.  . cetirizine (ZYRTEC) 10 MG tablet Take 10 mg by mouth daily.  Marland Kitchen denosumab (PROLIA) 60 MG/ML SOLN injection Inject 1 Dose into the skin every 6 (six) months.  . ezetimibe (ZETIA) 10 MG tablet Take 10 mg by mouth daily.  . Ferrous Fumarate (HEMOCYTE - 106 MG FE) 324 (106 Fe) MG TABS tablet Take 324 mg of iron by mouth daily.  . furosemide (LASIX) 40 MG tablet TK 1 T PO D  . gemfibrozil (LOPID) 600 MG tablet Take 600 mg by mouth 2 (two) times daily.  Marland Kitchen glimepiride (AMARYL) 1 MG tablet Take 0.5 mg by mouth  daily.  . Insulin Detemir (LEVEMIR FLEXTOUCH) 100 UNIT/ML Pen Inject 10 Units into the skin daily.  . isosorbide mononitrate (IMDUR) 30 MG 24 hr tablet Take 30 mg by mouth 3 (three) times daily.  . meclizine (ANTIVERT) 12.5 MG tablet Take 1 tablet by mouth every 8 (eight) hours as needed for dizziness.  . nitroGLYCERIN (NITROSTAT) 0.4 MG SL tablet Place 1 tablet (0.4 mg total) under the tongue every 5 (five) minutes as needed for chest pain.  . Omega-3 Fatty Acids (FISH OIL) 1000 MG CAPS Take 1 capsule by mouth 2 (two) times daily.  . pantoprazole (PROTONIX) 40 MG tablet Take 40 mg by mouth daily.  . potassium chloride SA (K-DUR,KLOR-CON) 20 MEQ tablet TK 1 T PO D  . pravastatin (PRAVACHOL) 10 MG tablet Take 1 tablet (10 mg total) by mouth 2 (two) times a week.     Allergies:   Codeine   Social History   Socioeconomic History  . Marital status: Widowed    Spouse name: Not on file  . Number of children: Not on file  . Years of education: Not on file  . Highest education level: Not on file  Occupational History  . Not on file  Social Needs  . Financial resource strain: Not on file  . Food insecurity:    Worry: Not on file    Inability: Not on file  . Transportation needs:    Medical: Not on file    Non-medical: Not on file  Tobacco Use  . Smoking status: Never Smoker  . Smokeless tobacco: Never Used  Substance and Sexual Activity  . Alcohol use: No  . Drug use: No  . Sexual activity: Not on file  Lifestyle  . Physical activity:    Days per week: Not on file    Minutes per session: Not on file  . Stress: Not on file  Relationships  . Social connections:    Talks on phone: Not on file    Gets together: Not on file    Attends religious service: Not on file    Active member of club or organization: Not on file    Attends meetings of clubs or organizations: Not on file    Relationship status: Not on file  Other Topics Concern  . Not on file  Social History Narrative  .  Not on file     Family History: The patient's family history includes Cancer in her brother; Heart attack in her father; Heart disease in her brother and sister; Stroke in her mother. ROS:   Please see the history of present illness.    All other systems reviewed and are negative.  EKGs/Labs/Other Studies Reviewed:    The following studies were reviewed today:  12/04/17 MPI: no ischemia, EF 79% 12/04/17 Echo: EF 60%  LAE RAE  CXR Bilateral L.R pleural effusion, heart failure Recent Labs: Hgb 9.6 Cr 1.90 K 5.0 BNP 1690  No results found for requested labs within last 8760 hours.  Recent Lipid Panel No results found for: CHOL, TRIG, HDL, CHOLHDL, VLDL, LDLCALC, LDLDIRECT  Physical Exam:    VS:  BP (!) 120/50 (BP Location: Right Arm, Patient Position: Sitting, Cuff Size: Normal)   Pulse 62   Ht 5\' 2"  (1.575 m)   Wt 132 lb (59.9 kg)   SpO2 95%   BMI 24.14 kg/m     Wt Readings from Last 3 Encounters:  12/20/17 132 lb (59.9 kg)  11/15/17 130 lb (59 kg)  05/09/17 132 lb 12.8 oz (60.2 kg)     GEN: Frail Well  in no acute distress HEENT: Normal NECK: No JVD; No carotid bruits LYMPHATICS: No lymphadenopathy CARDIAC: RRR, no murmurs, rubs, gallops RESPIRATORY:  Clear to auscultation without rales, wheezing or rhonchi  ABDOMEN: Soft, non-tender, non-distended MUSCULOSKELETAL:  4+ edema to the knees edema; No deformity  SKIN: Warm and dry NEUROLOGIC:  Alert and oriented x 3 PSYCHIATRIC:  Normal affect    Signed, Shirlee More, MD  12/20/2017 4:50 PM    [facility]

## 2017-12-21 ENCOUNTER — Telehealth: Payer: Self-pay | Admitting: Cardiology

## 2017-12-21 DIAGNOSIS — D638 Anemia in other chronic diseases classified elsewhere: Secondary | ICD-10-CM | POA: Diagnosis not present

## 2017-12-21 DIAGNOSIS — E119 Type 2 diabetes mellitus without complications: Secondary | ICD-10-CM | POA: Diagnosis not present

## 2017-12-21 MED ORDER — TORSEMIDE 20 MG PO TABS: 20.0000 mg | ORAL_TABLET | Freq: Two times a day (BID) | ORAL | 11 refills | Status: DC

## 2017-12-21 NOTE — Telephone Encounter (Signed)
Please call about furosemide, instructions he gave versus what script says.

## 2017-12-21 NOTE — Telephone Encounter (Signed)
Prescription for torsemide is supposed to be 20 mg twice daily. Prescription was sent for torsemide 20 mg once daily. Patient did confirm that she received 60 tablets. Advised patient to take torsemide 20 mg twice daily. When patient returns for follow up, if tolerating medication well and no changes need to be made, will send in 90 day supply. Patient verbalized understanding, no further questions.

## 2017-12-24 ENCOUNTER — Other Ambulatory Visit: Payer: Self-pay | Admitting: *Deleted

## 2017-12-24 DIAGNOSIS — I5030 Unspecified diastolic (congestive) heart failure: Secondary | ICD-10-CM | POA: Diagnosis not present

## 2017-12-24 DIAGNOSIS — Z6823 Body mass index (BMI) 23.0-23.9, adult: Secondary | ICD-10-CM | POA: Diagnosis not present

## 2017-12-24 DIAGNOSIS — N184 Chronic kidney disease, stage 4 (severe): Secondary | ICD-10-CM | POA: Diagnosis not present

## 2017-12-24 NOTE — Patient Outreach (Signed)
[facility]) Care Management  12/24/2017  Regina Herrera Dec 08, 1926 [id number]    Telephone Screen completed after services outreach calls. RN spoke with pt today who indicated she has [place] who had reviewed all her medications and continues to educate pt on her HF. Pt states she is wearing a HF vest that also monitors her HF. Pt also received shot to her blood count and has been attending all her appointments. Reports a primary provider appointment for today. Pt has sufficient transportation with no reported needs during the screening process. RN explained once again the purpose for today's call and offered further assistance with managing her HF or any other medical needs at this time. Pt states between her ongoing appointments, wearing the HF vest and having the [facility] visiting nurse educate her on her HF she feels she is doing good and does not wish to have [facility] involvement at this time but will contact us if additional needs arise in the future. RN has sent pt a letter with [facility] information and offered to provide once again during this call. No needs on this screening as referrals were declined. Pt will be non-active for pending services.   Raina Mina, RN Care Management Coordinator [place] Office [phone]

## 2017-12-28 ENCOUNTER — Ambulatory Visit: Payer: Self-pay | Admitting: *Deleted

## 2017-12-28 DIAGNOSIS — E119 Type 2 diabetes mellitus without complications: Secondary | ICD-10-CM | POA: Diagnosis not present

## 2017-12-28 DIAGNOSIS — D638 Anemia in other chronic diseases classified elsewhere: Secondary | ICD-10-CM | POA: Diagnosis not present

## 2017-12-31 DIAGNOSIS — I5030 Unspecified diastolic (congestive) heart failure: Secondary | ICD-10-CM | POA: Diagnosis not present

## 2017-12-31 DIAGNOSIS — Z6823 Body mass index (BMI) 23.0-23.9, adult: Secondary | ICD-10-CM | POA: Diagnosis not present

## 2018-01-03 DIAGNOSIS — E119 Type 2 diabetes mellitus without complications: Secondary | ICD-10-CM | POA: Diagnosis not present

## 2018-01-03 DIAGNOSIS — D638 Anemia in other chronic diseases classified elsewhere: Secondary | ICD-10-CM | POA: Diagnosis not present

## 2018-01-09 DIAGNOSIS — D638 Anemia in other chronic diseases classified elsewhere: Secondary | ICD-10-CM | POA: Diagnosis not present

## 2018-01-09 DIAGNOSIS — I129 Hypertensive chronic kidney disease with stage 1 through stage 4 chronic kidney disease, or unspecified chronic kidney disease: Secondary | ICD-10-CM | POA: Diagnosis not present

## 2018-01-09 DIAGNOSIS — E1122 Type 2 diabetes mellitus with diabetic chronic kidney disease: Secondary | ICD-10-CM | POA: Diagnosis not present

## 2018-01-09 DIAGNOSIS — I2510 Atherosclerotic heart disease of native coronary artery without angina pectoris: Secondary | ICD-10-CM | POA: Diagnosis not present

## 2018-01-09 DIAGNOSIS — N189 Chronic kidney disease, unspecified: Secondary | ICD-10-CM | POA: Diagnosis not present

## 2018-01-15 ENCOUNTER — Telehealth: Payer: Self-pay | Admitting: Cardiology

## 2018-01-15 ENCOUNTER — Other Ambulatory Visit: Payer: Self-pay

## 2018-01-15 MED ORDER — TORSEMIDE 20 MG PO TABS: 20.0000 mg | ORAL_TABLET | Freq: Two times a day (BID) | ORAL | 11 refills | Status: DC

## 2018-01-15 NOTE — Telephone Encounter (Signed)
Patient script said to take Torsemide 20mg  once a day but instructions was for twice a day. She will be out this Saturday so can script be corrected at pharmacy to twice a day?

## 2018-01-15 NOTE — Telephone Encounter (Signed)
Refill with correct dose sent to [facility]. Was originally sent to [facility].

## 2018-01-22 DIAGNOSIS — E1169 Type 2 diabetes mellitus with other specified complication: Secondary | ICD-10-CM | POA: Diagnosis not present

## 2018-01-22 DIAGNOSIS — E1159 Type 2 diabetes mellitus with other circulatory complications: Secondary | ICD-10-CM | POA: Diagnosis not present

## 2018-01-22 DIAGNOSIS — N184 Chronic kidney disease, stage 4 (severe): Secondary | ICD-10-CM | POA: Diagnosis not present

## 2018-01-27 NOTE — Progress Notes (Signed)
Cardiology Office Note:    Date:  01/28/2018   ID:  Regina Herrera, DOB 03-25-1927, MRN [id number]  PCP:  Marco Collie, MD  Cardiologist:  Shirlee More, MD    Referring MD: Marco Collie, MD    ASSESSMENT:    1. Chronic diastolic heart failure ([place])   2. Hypertensive heart disease with heart failure ([facility])   3. Stage 3 chronic kidney disease ([place])   4. Anemia, unspecified type    PLAN:    In order of problems listed above:  1. Improved back to baseline she will continue her current medication including torsemide.  Her hemoglobin is normal greater than 13 I reviewed labs her ferritin was low and asked her to stay on iron supplement. 2. Stable continue current treatment she is [place] Heart Association class I to class II compensators no fluid overload 3. Stable recent creatinine reviewed from [facility] scanned under media 4. Improved hemoglobin is normalized with Procrit and iron ferritin levels are low and asked to remain on low-dose iron with the poor outcome in patients with heart failure and iron deficiency anemia   Next appointment: 2 months   Medication Adjustments/Labs and Tests Ordered: Current medicines are reviewed at length with the patient today.  Concerns regarding medicines are outlined above.  No orders of the defined types were placed in this encounter.  No orders of the defined types were placed in this encounter.   Chief Complaint  Patient presents with  . Congestive Heart Failure    recently decompensated   . Hypertension  . Chronic Kidney Disease    stage 3   . Anemia    seeing hematology at [facility]  . Follow-up    History of Present Illness:    Regina Herrera is a 84 y.o. female with a hx of CAD, Dyslipidemia, HTN, S/P CABG,03/13/17 S/P PCIof her native RCA with Cypher DES 10/20/02 and chronic anemia Hgb 9.6 last seen 12/20/17.  ASSESSMENT:    1. Hypertensive heart disease with heart failure ([place])   2. Chronic diastolic heart failure  ([place])   3. Anemia, unspecified type   4. Stage 3 chronic kidney disease ([place])    PLAN:    1. Worsened heart failure I think she has become resistant to furosemide I will switch her to torsemide and try to get her back to her previous.dry weight in the range of 119 120 pounds.  iron deficiency anemia is a real problem here and that these patients do poorly especially if iron deficient and she will see hematology tomorrow and if iron deficient would benefit from parenteral iron therapy.   2. See above 3. Managed by hematology being seen tomorrow will require additional therapy Procrit or iron 4. Followed by her PCP  Compliance with diet, lifestyle and medications: Yes  Past Medical History:  Diagnosis Date  . Anemia 05/08/2017  . CKD (chronic kidney disease) 05/08/2017  . Coronary artery disease involving native coronary artery of native heart 06/09/2015   Overview:   . DM (diabetes mellitus) ([place]) 05/08/2017  . Essential hypertension 06/09/2015  . Hyperlipidemia 06/09/2015   Overview:  poor statin tolerance  . Iron deficiency anemia 06/10/2015  . LVH (left ventricular hypertrophy) 06/10/2015  . PAF (paroxysmal atrial fibrillation) ([place]) 05/08/2017  . Sinus bradycardia 05/08/2017  . Syncope 05/08/2017    Past Surgical History:  Procedure Laterality Date  . CARDIAC CATHETERIZATION    . CHOLECYSTECTOMY    . CORONARY ANGIOPLASTY    . CORONARY  ARTERY BYPASS GRAFT    . PARTIAL HYSTERECTOMY  1974    Current Medications: Current Meds  Medication Sig  . amLODipine (NORVASC) 10 MG tablet Take 10 mg by mouth daily.  . Ascorbic Acid (VITAMIN C) 100 MG tablet Take 100 mg by mouth daily.  Marland Kitchen aspirin 81 MG chewable tablet Chew 81 mg by mouth daily.  . cetirizine (ZYRTEC) 10 MG tablet Take 10 mg by mouth daily.  Marland Kitchen denosumab (PROLIA) 60 MG/ML SOLN injection Inject 1 Dose into the skin every 6 (six) months.  . ezetimibe (ZETIA) 10 MG tablet Take 10 mg by mouth daily.  Marland Kitchen gemfibrozil (LOPID) 600 MG  tablet Take 600 mg by mouth 2 (two) times daily.  Marland Kitchen glimepiride (AMARYL) 1 MG tablet Take 0.5 mg by mouth daily.  . Insulin Detemir (LEVEMIR FLEXTOUCH) 100 UNIT/ML Pen Inject 10 Units into the skin daily.  . isosorbide mononitrate (IMDUR) 30 MG 24 hr tablet Take 30 mg by mouth 3 (three) times daily.  . meclizine (ANTIVERT) 12.5 MG tablet Take 1 tablet by mouth every 8 (eight) hours as needed for dizziness.  . nitroGLYCERIN (NITROSTAT) 0.4 MG SL tablet Place 1 tablet (0.4 mg total) under the tongue every 5 (five) minutes as needed for chest pain.  . Omega-3 Fatty Acids (FISH OIL) 1000 MG CAPS Take 1 capsule by mouth 2 (two) times daily.  . pantoprazole (PROTONIX) 40 MG tablet Take 40 mg by mouth daily.  . pravastatin (PRAVACHOL) 10 MG tablet Take 1 tablet (10 mg total) by mouth 2 (two) times a week. (Patient taking differently: Take 10 mg by mouth 2 (two) times a week. Patient takes half a pill twice weekly, unsure of dose.)  . torsemide (DEMADEX) 20 MG tablet Take 1 tablet (20 mg total) by mouth 2 (two) times daily.     Allergies:   Ciprofloxacin; Lipitor [atorvastatin calcium]; and Codeine   Social History   Socioeconomic History  . Marital status: Widowed    Spouse name: Not on file  . Number of children: Not on file  . Years of education: Not on file  . Highest education level: Not on file  Occupational History  . Not on file  Social Needs  . Financial resource strain: Not on file  . Food insecurity:    Worry: Not on file    Inability: Not on file  . Transportation needs:    Medical: Not on file    Non-medical: Not on file  Tobacco Use  . Smoking status: Former Research scientist (life sciences)  . Smokeless tobacco: Never Used  Substance and Sexual Activity  . Alcohol use: No  . Drug use: Never  . Sexual activity: Not on file  Lifestyle  . Physical activity:    Days per week: Not on file    Minutes per session: Not on file  . Stress: Not on file  Relationships  . Social connections:    Talks on  phone: Not on file    Gets together: Not on file    Attends religious service: Not on file    Active member of club or organization: Not on file    Attends meetings of clubs or organizations: Not on file    Relationship status: Not on file  Other Topics Concern  . Not on file  Social History Narrative  . Not on file     Family History: The patient's family history includes Cancer in her brother; Heart attack in her father; Heart disease in her brother and  sister; Stroke in her mother. ROS:   Please see the history of present illness.    All other systems reviewed and are negative.  EKGs/Labs/Other Studies Reviewed:    The following studies were reviewed today  Recent Labs: From hematology at [facility] hemoglobin 13.9 creatinine 1.4.  She tells me her recent lung water vest reading was 26% No results found for requested labs within last 8760 hours.  Recent Lipid Panel No results found for: CHOL, TRIG, HDL, CHOLHDL, VLDL, LDLCALC, LDLDIRECT  Physical Exam:    VS:  BP 132/70 (BP Location: Right Arm, Patient Position: Sitting)   Pulse 85   Ht 5\' 1"  (1.549 m)   Wt 126 lb (57.2 kg)   SpO2 96%   BMI 23.81 kg/m     Wt Readings from Last 3 Encounters:  01/28/18 126 lb (57.2 kg)  12/20/17 132 lb (59.9 kg)  11/15/17 130 lb (59 kg)     GEN: She looks elderly and frail well nourished, well developed in no acute distress HEENT: Normal NECK: No JVD; No carotid bruits LYMPHATICS: No lymphadenopathy CARDIAC: RRR, no murmurs, rubs, gallops RESPIRATORY:  Clear to auscultation without rales, wheezing or rhonchi  ABDOMEN: Soft, non-tender, non-distended MUSCULOSKELETAL:  No edema; No deformity  SKIN: Warm and dry NEUROLOGIC:  Alert and oriented x 3 PSYCHIATRIC:  Normal affect    Signed, Shirlee More, MD  01/28/2018 4:10 PM    [facility]

## 2018-01-28 ENCOUNTER — Ambulatory Visit (INDEPENDENT_AMBULATORY_CARE_PROVIDER_SITE_OTHER): Payer: PPO | Admitting: Cardiology

## 2018-01-28 ENCOUNTER — Encounter: Payer: Self-pay | Admitting: Cardiology

## 2018-01-28 VITALS — BP 132/70 | HR 85 | Ht 61.0 in | Wt 126.0 lb

## 2018-01-28 DIAGNOSIS — N183 Chronic kidney disease, stage 3 unspecified: Secondary | ICD-10-CM

## 2018-01-28 DIAGNOSIS — I110 Hypertensive heart disease with heart failure: Secondary | ICD-10-CM

## 2018-01-28 DIAGNOSIS — D649 Anemia, unspecified: Secondary | ICD-10-CM

## 2018-01-28 DIAGNOSIS — I5032 Chronic diastolic (congestive) heart failure: Secondary | ICD-10-CM | POA: Diagnosis not present

## 2018-01-28 NOTE — Patient Instructions (Signed)
Medication Instructions:   Your physician recommends that you continue on your current medications as directed. Please refer to the Current Medication list given to you today.  Labwork:  None  Testing/Procedures:  None  Follow-Up:  Your physician recommends that you schedule a follow-up appointment in: 2 months.  Any Other Special Instructions Will Be Listed Below (If Applicable).  If you need a refill on your cardiac medications before your next appointment, please call your pharmacy. 

## 2018-01-31 DIAGNOSIS — I5030 Unspecified diastolic (congestive) heart failure: Secondary | ICD-10-CM | POA: Diagnosis not present

## 2018-01-31 DIAGNOSIS — D631 Anemia in chronic kidney disease: Secondary | ICD-10-CM | POA: Diagnosis not present

## 2018-01-31 DIAGNOSIS — N184 Chronic kidney disease, stage 4 (severe): Secondary | ICD-10-CM | POA: Diagnosis not present

## 2018-01-31 DIAGNOSIS — N189 Chronic kidney disease, unspecified: Secondary | ICD-10-CM | POA: Diagnosis not present

## 2018-02-04 DIAGNOSIS — E782 Mixed hyperlipidemia: Secondary | ICD-10-CM | POA: Diagnosis not present

## 2018-02-04 DIAGNOSIS — E1169 Type 2 diabetes mellitus with other specified complication: Secondary | ICD-10-CM | POA: Diagnosis not present

## 2018-02-04 DIAGNOSIS — I1310 Hypertensive heart and chronic kidney disease without heart failure, with stage 1 through stage 4 chronic kidney disease, or unspecified chronic kidney disease: Secondary | ICD-10-CM | POA: Diagnosis not present

## 2018-02-04 DIAGNOSIS — N184 Chronic kidney disease, stage 4 (severe): Secondary | ICD-10-CM | POA: Diagnosis not present

## 2018-02-15 DIAGNOSIS — E1169 Type 2 diabetes mellitus with other specified complication: Secondary | ICD-10-CM | POA: Diagnosis not present

## 2018-02-15 DIAGNOSIS — N184 Chronic kidney disease, stage 4 (severe): Secondary | ICD-10-CM | POA: Diagnosis not present

## 2018-03-07 DIAGNOSIS — I5030 Unspecified diastolic (congestive) heart failure: Secondary | ICD-10-CM | POA: Diagnosis not present

## 2018-03-07 DIAGNOSIS — D631 Anemia in chronic kidney disease: Secondary | ICD-10-CM | POA: Diagnosis not present

## 2018-03-07 DIAGNOSIS — Z6822 Body mass index (BMI) 22.0-22.9, adult: Secondary | ICD-10-CM | POA: Diagnosis not present

## 2018-03-07 DIAGNOSIS — N189 Chronic kidney disease, unspecified: Secondary | ICD-10-CM | POA: Diagnosis not present

## 2018-03-08 DIAGNOSIS — E119 Type 2 diabetes mellitus without complications: Secondary | ICD-10-CM | POA: Diagnosis not present

## 2018-03-08 DIAGNOSIS — D638 Anemia in other chronic diseases classified elsewhere: Secondary | ICD-10-CM | POA: Diagnosis not present

## 2018-03-17 DIAGNOSIS — E1169 Type 2 diabetes mellitus with other specified complication: Secondary | ICD-10-CM | POA: Diagnosis not present

## 2018-03-17 DIAGNOSIS — D631 Anemia in chronic kidney disease: Secondary | ICD-10-CM | POA: Diagnosis not present

## 2018-03-17 DIAGNOSIS — I5030 Unspecified diastolic (congestive) heart failure: Secondary | ICD-10-CM | POA: Diagnosis not present

## 2018-03-17 DIAGNOSIS — N189 Chronic kidney disease, unspecified: Secondary | ICD-10-CM | POA: Diagnosis not present

## 2018-03-28 NOTE — Progress Notes (Signed)
Cardiology Office Note:    Date:  03/29/2018   ID:  Regina Herrera, DOB 05-29-1927, MRN [id number]  PCP:  Marco Collie, MD  Cardiologist:  Shirlee More, MD    Referring MD: Marco Collie, MD    ASSESSMENT:    1. Chronic diastolic heart failure ([place])   2. Hypertensive heart disease with heart failure ([place])   3. Coronary artery disease of native artery of native heart with stable angina pectoris ([place])   4. PAF (paroxysmal atrial fibrillation) ([place])   5. Iron deficiency anemia, unspecified iron deficiency anemia type   6. Stage 3 chronic kidney disease ([place])    PLAN:    In order of problems listed above:  1. Improved compensated she will continue her current self management close medical supervision and loop diuretic 2. Blood pressure is stable continue current treatment 3. Stable continue medical treatment with aspirin low intensity statin and I do not see a drive for ischemia evaluation at this time 4. Stable no recurrence 5. Improved managed by hematology 6. Stable recent labs requested from her PCP continue her current loop diuretic   Next appointment: 6 months   Medication Adjustments/Labs and Tests Ordered: Current medicines are reviewed at length with the patient today.  Concerns regarding medicines are outlined above.  No orders of the defined types were placed in this encounter.  No orders of the defined types were placed in this encounter.   Chief Complaint  Patient presents with  . Congestive Heart Failure  . Coronary Artery Disease  . Hypertension    History of Present Illness:    Regina Herrera is a 84 y.o. female with a hx of heart failure, CAD, Dyslipidemia, HTN, S/P CABG,  03/13/17 S/P PCI  of her native RCA with Cypher DES 10/20/02 and chronic anemia Hgb 9.6  last seen 01/28/18. Compliance with diet, lifestyle and medications: Yes  She feels improved no edema shortness of breath chest pain palpitations syncope.  Follows closely with her PCP with the  heart failure vest her last determination is in range of 25% on target and hematology for iron deficiency anemia. Past Medical History:  Diagnosis Date  . Anemia 05/08/2017  . CKD (chronic kidney disease) 05/08/2017  . Coronary artery disease involving native coronary artery of native heart 06/09/2015   Overview:   . DM (diabetes mellitus) ([place]) 05/08/2017  . Essential hypertension 06/09/2015  . Hyperlipidemia 06/09/2015   Overview:  poor statin tolerance  . Iron deficiency anemia 06/10/2015  . LVH (left ventricular hypertrophy) 06/10/2015  . PAF (paroxysmal atrial fibrillation) ([place]) 05/08/2017  . Sinus bradycardia 05/08/2017  . Syncope 05/08/2017    Past Surgical History:  Procedure Laterality Date  . CARDIAC CATHETERIZATION    . CHOLECYSTECTOMY    . CORONARY ANGIOPLASTY    . CORONARY ARTERY BYPASS GRAFT    . PARTIAL HYSTERECTOMY  1974    Current Medications: Current Meds  Medication Sig  . amLODipine (NORVASC) 10 MG tablet Take 10 mg by mouth daily.  . Ascorbic Acid (VITAMIN C) 100 MG tablet Take 100 mg by mouth daily.  Marland Kitchen aspirin 81 MG chewable tablet Chew 81 mg by mouth daily.  . cetirizine (ZYRTEC) 10 MG tablet Take 10 mg by mouth daily.  Marland Kitchen denosumab (PROLIA) 60 MG/ML SOLN injection Inject 1 Dose into the skin every 6 (six) months.  . ezetimibe (ZETIA) 10 MG tablet Take 10 mg by mouth daily.  Marland Kitchen gemfibrozil (LOPID) 600 MG tablet Take 600 mg by mouth  2 (two) times daily.  Marland Kitchen glimepiride (AMARYL) 1 MG tablet Take 0.5 mg by mouth daily.  . Insulin Detemir (LEVEMIR FLEXTOUCH) 100 UNIT/ML Pen Inject 10 Units into the skin daily.  . isosorbide mononitrate (IMDUR) 30 MG 24 hr tablet Take 30 mg by mouth 3 (three) times daily.  . meclizine (ANTIVERT) 12.5 MG tablet Take 1 tablet by mouth every 8 (eight) hours as needed for dizziness.  . nitroGLYCERIN (NITROSTAT) 0.4 MG SL tablet Place 1 tablet (0.4 mg total) under the tongue every 5 (five) minutes as needed for chest pain.  . Omega-3 Fatty  Acids (FISH OIL) 1000 MG CAPS Take 1 capsule by mouth 2 (two) times daily.  . pantoprazole (PROTONIX) 40 MG tablet Take 40 mg by mouth daily.  . pravastatin (PRAVACHOL) 10 MG tablet Take 1 tablet (10 mg total) by mouth 2 (two) times a week. (Patient taking differently: Take 10 mg by mouth 2 (two) times a week. Patient takes half a pill twice weekly, unsure of dose.)  . torsemide (DEMADEX) 20 MG tablet Take 1 tablet (20 mg total) by mouth 2 (two) times daily.     Allergies:   Ciprofloxacin; Lipitor [atorvastatin calcium]; and Codeine   Social History   Socioeconomic History  . Marital status: Widowed    Spouse name: Not on file  . Number of children: Not on file  . Years of education: Not on file  . Highest education level: Not on file  Occupational History  . Not on file  Social Needs  . Financial resource strain: Not on file  . Food insecurity:    Worry: Not on file    Inability: Not on file  . Transportation needs:    Medical: Not on file    Non-medical: Not on file  Tobacco Use  . Smoking status: Former Research scientist (life sciences)  . Smokeless tobacco: Never Used  Substance and Sexual Activity  . Alcohol use: No  . Drug use: Never  . Sexual activity: Not on file  Lifestyle  . Physical activity:    Days per week: Not on file    Minutes per session: Not on file  . Stress: Not on file  Relationships  . Social connections:    Talks on phone: Not on file    Gets together: Not on file    Attends religious service: Not on file    Active member of club or organization: Not on file    Attends meetings of clubs or organizations: Not on file    Relationship status: Not on file  Other Topics Concern  . Not on file  Social History Narrative  . Not on file     Family History: The patient's family history includes Cancer in her brother; Heart attack in her father; Heart disease in her brother and sister; Stroke in her mother. ROS:   Please see the history of present illness.    All other  systems reviewed and are negative.  EKGs/Labs/Other Studies Reviewed:    The following studies were reviewed today:  Recent Labs: No results found for requested labs within last 8760 hours.  Recent Lipid Panel No results found for: CHOL, TRIG, HDL, CHOLHDL, VLDL, LDLCALC, LDLDIRECT  Physical Exam:    VS:  BP 130/68 (BP Location: Right Arm, Patient Position: Sitting, Cuff Size: Normal)   Pulse 77   Ht 5\' 1"  (1.549 m)   Wt 126 lb (57.2 kg)   SpO2 94%   BMI 23.81 kg/m     Wt  Readings from Last 3 Encounters:  03/29/18 126 lb (57.2 kg)  01/28/18 126 lb (57.2 kg)  12/20/17 132 lb (59.9 kg)     GEN:  Well nourished, well developed in no acute distress HEENT: Normal NECK: No JVD; No carotid bruits LYMPHATICS: No lymphadenopathy CARDIAC: RRR, no murmurs, rubs, gallops RESPIRATORY:  Clear to auscultation without rales, wheezing or rhonchi  ABDOMEN: Soft, non-tender, non-distended MUSCULOSKELETAL:  No edema; No deformity  SKIN: Warm and dry NEUROLOGIC:  Alert and oriented x 3 PSYCHIATRIC:  Normal affect    Signed, Shirlee More, MD  03/29/2018 2:03 PM    [place]

## 2018-03-29 ENCOUNTER — Encounter: Payer: Self-pay | Admitting: Cardiology

## 2018-03-29 ENCOUNTER — Ambulatory Visit (INDEPENDENT_AMBULATORY_CARE_PROVIDER_SITE_OTHER): Payer: PPO | Admitting: Cardiology

## 2018-03-29 VITALS — BP 130/68 | HR 77 | Ht 61.0 in | Wt 126.0 lb

## 2018-03-29 DIAGNOSIS — N183 Chronic kidney disease, stage 3 unspecified: Secondary | ICD-10-CM

## 2018-03-29 DIAGNOSIS — I5032 Chronic diastolic (congestive) heart failure: Secondary | ICD-10-CM

## 2018-03-29 DIAGNOSIS — D509 Iron deficiency anemia, unspecified: Secondary | ICD-10-CM | POA: Diagnosis not present

## 2018-03-29 DIAGNOSIS — I25118 Atherosclerotic heart disease of native coronary artery with other forms of angina pectoris: Secondary | ICD-10-CM | POA: Diagnosis not present

## 2018-03-29 DIAGNOSIS — I110 Hypertensive heart disease with heart failure: Secondary | ICD-10-CM

## 2018-03-29 DIAGNOSIS — I480 Paroxysmal atrial fibrillation: Secondary | ICD-10-CM

## 2018-03-29 NOTE — Patient Instructions (Addendum)
Medication Instructions:  Your physician recommends that you continue on your current medications as directed. Please refer to the Current Medication list given to you today.   Labwork: NONE  Testing/Procedures: NONE  Follow-Up: Your physician wants you to follow-up in: 6 months, You will receive a reminder letter in the mail two months in advance. If you don't receive a letter, please call our office to schedule the follow-up appointment.   Any Other Special Instructions Will Be Listed Below (If Applicable).     If you need a refill on your cardiac medications before your next appointment, please call your pharmacy.    Heart Failure  Weigh yourself every morning when you first wake up and record on a calender or note pad, bring this to your office visits. Using a pill tender can help with taking your medications consistently.  Limit your fluid intake to 2 liters daily  Limit your sodium intake to less than 2-3 grams daily. Ask if you need dietary teaching.  If you gain more than 3 pounds (from your dry weight ), double your dose of diuretic for the day.  If you gain more than 5 pounds (from your dry weight), double your dose of lasix and call your heart failure doctor.  Please do not smoke tobacco since it is very bad for your heart.  Please do not drink alcohol since it can worsen your heart failure.Also avoid OTC nonsteroidal drugs, such as advil, aleve and motrin.  Try to exercise for at least 30 minutes every day because this will help your heart be more efficient. You may be eligible for supervised cardiac rehab, ask your physician.

## 2018-04-04 DIAGNOSIS — D631 Anemia in chronic kidney disease: Secondary | ICD-10-CM | POA: Diagnosis not present

## 2018-04-04 DIAGNOSIS — E1169 Type 2 diabetes mellitus with other specified complication: Secondary | ICD-10-CM | POA: Diagnosis not present

## 2018-04-04 DIAGNOSIS — N184 Chronic kidney disease, stage 4 (severe): Secondary | ICD-10-CM | POA: Diagnosis not present

## 2018-04-04 DIAGNOSIS — Z6822 Body mass index (BMI) 22.0-22.9, adult: Secondary | ICD-10-CM | POA: Diagnosis not present

## 2018-04-04 DIAGNOSIS — N189 Chronic kidney disease, unspecified: Secondary | ICD-10-CM | POA: Diagnosis not present

## 2018-04-04 DIAGNOSIS — D638 Anemia in other chronic diseases classified elsewhere: Secondary | ICD-10-CM | POA: Diagnosis not present

## 2018-04-04 DIAGNOSIS — D692 Other nonthrombocytopenic purpura: Secondary | ICD-10-CM | POA: Diagnosis not present

## 2018-04-04 DIAGNOSIS — I5030 Unspecified diastolic (congestive) heart failure: Secondary | ICD-10-CM | POA: Diagnosis not present

## 2018-04-12 DIAGNOSIS — R300 Dysuria: Secondary | ICD-10-CM | POA: Diagnosis not present

## 2018-04-12 DIAGNOSIS — Z6822 Body mass index (BMI) 22.0-22.9, adult: Secondary | ICD-10-CM | POA: Diagnosis not present

## 2018-04-17 DIAGNOSIS — E1169 Type 2 diabetes mellitus with other specified complication: Secondary | ICD-10-CM | POA: Diagnosis not present

## 2018-04-17 DIAGNOSIS — N189 Chronic kidney disease, unspecified: Secondary | ICD-10-CM | POA: Diagnosis not present

## 2018-04-17 DIAGNOSIS — D631 Anemia in chronic kidney disease: Secondary | ICD-10-CM | POA: Diagnosis not present

## 2018-04-17 DIAGNOSIS — I5030 Unspecified diastolic (congestive) heart failure: Secondary | ICD-10-CM | POA: Diagnosis not present

## 2018-05-09 DIAGNOSIS — I5030 Unspecified diastolic (congestive) heart failure: Secondary | ICD-10-CM | POA: Diagnosis not present

## 2018-05-09 DIAGNOSIS — Z6823 Body mass index (BMI) 23.0-23.9, adult: Secondary | ICD-10-CM | POA: Diagnosis not present

## 2018-05-09 DIAGNOSIS — D509 Iron deficiency anemia, unspecified: Secondary | ICD-10-CM | POA: Diagnosis not present

## 2018-05-16 DIAGNOSIS — D649 Anemia, unspecified: Secondary | ICD-10-CM | POA: Diagnosis not present

## 2018-05-16 DIAGNOSIS — D638 Anemia in other chronic diseases classified elsewhere: Secondary | ICD-10-CM | POA: Diagnosis not present

## 2018-05-16 DIAGNOSIS — M810 Age-related osteoporosis without current pathological fracture: Secondary | ICD-10-CM | POA: Diagnosis not present

## 2018-05-16 DIAGNOSIS — I5030 Unspecified diastolic (congestive) heart failure: Secondary | ICD-10-CM | POA: Diagnosis not present

## 2018-05-17 DIAGNOSIS — N189 Chronic kidney disease, unspecified: Secondary | ICD-10-CM | POA: Diagnosis not present

## 2018-05-17 DIAGNOSIS — I5030 Unspecified diastolic (congestive) heart failure: Secondary | ICD-10-CM | POA: Diagnosis not present

## 2018-05-17 DIAGNOSIS — D631 Anemia in chronic kidney disease: Secondary | ICD-10-CM | POA: Diagnosis not present

## 2018-05-17 DIAGNOSIS — E1169 Type 2 diabetes mellitus with other specified complication: Secondary | ICD-10-CM | POA: Diagnosis not present

## 2018-05-19 DIAGNOSIS — D638 Anemia in other chronic diseases classified elsewhere: Secondary | ICD-10-CM | POA: Diagnosis not present

## 2018-05-19 DIAGNOSIS — E1169 Type 2 diabetes mellitus with other specified complication: Secondary | ICD-10-CM | POA: Diagnosis not present

## 2018-05-21 DIAGNOSIS — Z6823 Body mass index (BMI) 23.0-23.9, adult: Secondary | ICD-10-CM | POA: Diagnosis not present

## 2018-05-21 DIAGNOSIS — I5030 Unspecified diastolic (congestive) heart failure: Secondary | ICD-10-CM | POA: Diagnosis not present

## 2018-05-24 DIAGNOSIS — D631 Anemia in chronic kidney disease: Secondary | ICD-10-CM | POA: Diagnosis not present

## 2018-05-24 DIAGNOSIS — I5030 Unspecified diastolic (congestive) heart failure: Secondary | ICD-10-CM | POA: Diagnosis not present

## 2018-05-24 DIAGNOSIS — N189 Chronic kidney disease, unspecified: Secondary | ICD-10-CM | POA: Diagnosis not present

## 2018-05-24 DIAGNOSIS — Z23 Encounter for immunization: Secondary | ICD-10-CM | POA: Diagnosis not present

## 2018-05-30 DIAGNOSIS — D631 Anemia in chronic kidney disease: Secondary | ICD-10-CM | POA: Diagnosis not present

## 2018-05-30 DIAGNOSIS — N189 Chronic kidney disease, unspecified: Secondary | ICD-10-CM | POA: Diagnosis not present

## 2018-05-31 DIAGNOSIS — N184 Chronic kidney disease, stage 4 (severe): Secondary | ICD-10-CM | POA: Diagnosis not present

## 2018-05-31 DIAGNOSIS — E1169 Type 2 diabetes mellitus with other specified complication: Secondary | ICD-10-CM | POA: Diagnosis not present

## 2018-05-31 DIAGNOSIS — E1159 Type 2 diabetes mellitus with other circulatory complications: Secondary | ICD-10-CM | POA: Diagnosis not present

## 2018-06-10 DIAGNOSIS — E782 Mixed hyperlipidemia: Secondary | ICD-10-CM | POA: Diagnosis not present

## 2018-06-10 DIAGNOSIS — D638 Anemia in other chronic diseases classified elsewhere: Secondary | ICD-10-CM | POA: Diagnosis not present

## 2018-06-10 DIAGNOSIS — N184 Chronic kidney disease, stage 4 (severe): Secondary | ICD-10-CM | POA: Diagnosis not present

## 2018-06-10 DIAGNOSIS — I1310 Hypertensive heart and chronic kidney disease without heart failure, with stage 1 through stage 4 chronic kidney disease, or unspecified chronic kidney disease: Secondary | ICD-10-CM | POA: Diagnosis not present

## 2018-06-10 DIAGNOSIS — I5032 Chronic diastolic (congestive) heart failure: Secondary | ICD-10-CM | POA: Diagnosis not present

## 2018-06-10 DIAGNOSIS — E1169 Type 2 diabetes mellitus with other specified complication: Secondary | ICD-10-CM | POA: Diagnosis not present

## 2018-06-17 DIAGNOSIS — I5032 Chronic diastolic (congestive) heart failure: Secondary | ICD-10-CM | POA: Diagnosis not present

## 2018-06-17 DIAGNOSIS — E1169 Type 2 diabetes mellitus with other specified complication: Secondary | ICD-10-CM | POA: Diagnosis not present

## 2018-06-17 DIAGNOSIS — E782 Mixed hyperlipidemia: Secondary | ICD-10-CM | POA: Diagnosis not present

## 2018-06-17 DIAGNOSIS — I1310 Hypertensive heart and chronic kidney disease without heart failure, with stage 1 through stage 4 chronic kidney disease, or unspecified chronic kidney disease: Secondary | ICD-10-CM | POA: Diagnosis not present

## 2018-06-19 DIAGNOSIS — K5730 Diverticulosis of large intestine without perforation or abscess without bleeding: Secondary | ICD-10-CM | POA: Diagnosis not present

## 2018-06-19 DIAGNOSIS — D649 Anemia, unspecified: Secondary | ICD-10-CM | POA: Diagnosis not present

## 2018-06-27 DIAGNOSIS — N189 Chronic kidney disease, unspecified: Secondary | ICD-10-CM | POA: Diagnosis not present

## 2018-06-27 DIAGNOSIS — D631 Anemia in chronic kidney disease: Secondary | ICD-10-CM | POA: Diagnosis not present

## 2018-07-08 DIAGNOSIS — I5030 Unspecified diastolic (congestive) heart failure: Secondary | ICD-10-CM | POA: Diagnosis not present

## 2018-07-08 DIAGNOSIS — N184 Chronic kidney disease, stage 4 (severe): Secondary | ICD-10-CM | POA: Diagnosis not present

## 2018-07-08 DIAGNOSIS — Z7189 Other specified counseling: Secondary | ICD-10-CM | POA: Diagnosis not present

## 2018-07-08 DIAGNOSIS — D638 Anemia in other chronic diseases classified elsewhere: Secondary | ICD-10-CM | POA: Diagnosis not present

## 2018-07-18 DIAGNOSIS — D638 Anemia in other chronic diseases classified elsewhere: Secondary | ICD-10-CM | POA: Diagnosis not present

## 2018-07-18 DIAGNOSIS — I5030 Unspecified diastolic (congestive) heart failure: Secondary | ICD-10-CM | POA: Diagnosis not present

## 2018-07-18 DIAGNOSIS — N184 Chronic kidney disease, stage 4 (severe): Secondary | ICD-10-CM | POA: Diagnosis not present

## 2018-07-18 DIAGNOSIS — I5032 Chronic diastolic (congestive) heart failure: Secondary | ICD-10-CM | POA: Diagnosis not present

## 2018-07-22 DIAGNOSIS — I5030 Unspecified diastolic (congestive) heart failure: Secondary | ICD-10-CM | POA: Diagnosis not present

## 2018-07-22 DIAGNOSIS — D509 Iron deficiency anemia, unspecified: Secondary | ICD-10-CM | POA: Diagnosis not present

## 2018-07-22 DIAGNOSIS — Z6823 Body mass index (BMI) 23.0-23.9, adult: Secondary | ICD-10-CM | POA: Diagnosis not present

## 2018-08-05 DIAGNOSIS — Z6823 Body mass index (BMI) 23.0-23.9, adult: Secondary | ICD-10-CM | POA: Diagnosis not present

## 2018-08-05 DIAGNOSIS — D509 Iron deficiency anemia, unspecified: Secondary | ICD-10-CM | POA: Diagnosis not present

## 2018-08-05 DIAGNOSIS — I5030 Unspecified diastolic (congestive) heart failure: Secondary | ICD-10-CM | POA: Diagnosis not present

## 2018-08-17 DIAGNOSIS — D638 Anemia in other chronic diseases classified elsewhere: Secondary | ICD-10-CM | POA: Diagnosis not present

## 2018-08-17 DIAGNOSIS — D509 Iron deficiency anemia, unspecified: Secondary | ICD-10-CM | POA: Diagnosis not present

## 2018-08-17 DIAGNOSIS — N184 Chronic kidney disease, stage 4 (severe): Secondary | ICD-10-CM | POA: Diagnosis not present

## 2018-08-17 DIAGNOSIS — I5030 Unspecified diastolic (congestive) heart failure: Secondary | ICD-10-CM | POA: Diagnosis not present

## 2018-08-19 DIAGNOSIS — Z6823 Body mass index (BMI) 23.0-23.9, adult: Secondary | ICD-10-CM | POA: Diagnosis not present

## 2018-08-19 DIAGNOSIS — I5030 Unspecified diastolic (congestive) heart failure: Secondary | ICD-10-CM | POA: Diagnosis not present

## 2018-08-19 DIAGNOSIS — D509 Iron deficiency anemia, unspecified: Secondary | ICD-10-CM | POA: Diagnosis not present

## 2018-09-02 DIAGNOSIS — I5030 Unspecified diastolic (congestive) heart failure: Secondary | ICD-10-CM | POA: Diagnosis not present

## 2018-09-02 DIAGNOSIS — Z6823 Body mass index (BMI) 23.0-23.9, adult: Secondary | ICD-10-CM | POA: Diagnosis not present

## 2018-09-02 DIAGNOSIS — D509 Iron deficiency anemia, unspecified: Secondary | ICD-10-CM | POA: Diagnosis not present

## 2018-09-03 DIAGNOSIS — E1159 Type 2 diabetes mellitus with other circulatory complications: Secondary | ICD-10-CM | POA: Diagnosis not present

## 2018-09-03 DIAGNOSIS — N184 Chronic kidney disease, stage 4 (severe): Secondary | ICD-10-CM | POA: Diagnosis not present

## 2018-09-03 DIAGNOSIS — E1169 Type 2 diabetes mellitus with other specified complication: Secondary | ICD-10-CM | POA: Diagnosis not present

## 2018-09-16 DIAGNOSIS — N184 Chronic kidney disease, stage 4 (severe): Secondary | ICD-10-CM | POA: Diagnosis not present

## 2018-09-16 DIAGNOSIS — D509 Iron deficiency anemia, unspecified: Secondary | ICD-10-CM | POA: Diagnosis not present

## 2018-09-16 DIAGNOSIS — E782 Mixed hyperlipidemia: Secondary | ICD-10-CM | POA: Diagnosis not present

## 2018-09-16 DIAGNOSIS — I1310 Hypertensive heart and chronic kidney disease without heart failure, with stage 1 through stage 4 chronic kidney disease, or unspecified chronic kidney disease: Secondary | ICD-10-CM | POA: Diagnosis not present

## 2018-09-16 DIAGNOSIS — I5030 Unspecified diastolic (congestive) heart failure: Secondary | ICD-10-CM | POA: Diagnosis not present

## 2018-09-16 DIAGNOSIS — E1169 Type 2 diabetes mellitus with other specified complication: Secondary | ICD-10-CM | POA: Diagnosis not present

## 2018-09-17 DIAGNOSIS — I1310 Hypertensive heart and chronic kidney disease without heart failure, with stage 1 through stage 4 chronic kidney disease, or unspecified chronic kidney disease: Secondary | ICD-10-CM | POA: Diagnosis not present

## 2018-09-17 DIAGNOSIS — E782 Mixed hyperlipidemia: Secondary | ICD-10-CM | POA: Diagnosis not present

## 2018-09-17 DIAGNOSIS — I5030 Unspecified diastolic (congestive) heart failure: Secondary | ICD-10-CM | POA: Diagnosis not present

## 2018-09-17 DIAGNOSIS — E1169 Type 2 diabetes mellitus with other specified complication: Secondary | ICD-10-CM | POA: Diagnosis not present

## 2018-09-26 DIAGNOSIS — E1122 Type 2 diabetes mellitus with diabetic chronic kidney disease: Secondary | ICD-10-CM | POA: Diagnosis not present

## 2018-09-26 DIAGNOSIS — D631 Anemia in chronic kidney disease: Secondary | ICD-10-CM | POA: Diagnosis not present

## 2018-09-26 DIAGNOSIS — I509 Heart failure, unspecified: Secondary | ICD-10-CM | POA: Diagnosis not present

## 2018-09-26 DIAGNOSIS — E611 Iron deficiency: Secondary | ICD-10-CM | POA: Diagnosis not present

## 2018-09-26 DIAGNOSIS — I129 Hypertensive chronic kidney disease with stage 1 through stage 4 chronic kidney disease, or unspecified chronic kidney disease: Secondary | ICD-10-CM | POA: Diagnosis not present

## 2018-09-26 DIAGNOSIS — N189 Chronic kidney disease, unspecified: Secondary | ICD-10-CM | POA: Diagnosis not present

## 2018-09-26 DIAGNOSIS — D638 Anemia in other chronic diseases classified elsewhere: Secondary | ICD-10-CM | POA: Diagnosis not present

## 2018-10-07 DIAGNOSIS — Z0000 Encounter for general adult medical examination without abnormal findings: Secondary | ICD-10-CM | POA: Diagnosis not present

## 2018-10-07 DIAGNOSIS — I5030 Unspecified diastolic (congestive) heart failure: Secondary | ICD-10-CM | POA: Diagnosis not present

## 2018-10-07 DIAGNOSIS — D638 Anemia in other chronic diseases classified elsewhere: Secondary | ICD-10-CM | POA: Diagnosis not present

## 2018-10-07 DIAGNOSIS — Z139 Encounter for screening, unspecified: Secondary | ICD-10-CM | POA: Diagnosis not present

## 2018-10-07 DIAGNOSIS — Z1331 Encounter for screening for depression: Secondary | ICD-10-CM | POA: Diagnosis not present

## 2018-10-18 DIAGNOSIS — E1169 Type 2 diabetes mellitus with other specified complication: Secondary | ICD-10-CM | POA: Diagnosis not present

## 2018-10-18 DIAGNOSIS — E782 Mixed hyperlipidemia: Secondary | ICD-10-CM | POA: Diagnosis not present

## 2018-10-18 DIAGNOSIS — D638 Anemia in other chronic diseases classified elsewhere: Secondary | ICD-10-CM | POA: Diagnosis not present

## 2018-10-18 DIAGNOSIS — I5030 Unspecified diastolic (congestive) heart failure: Secondary | ICD-10-CM | POA: Diagnosis not present

## 2018-10-23 NOTE — Progress Notes (Signed)
Cardiology Office Note:    Date:  10/24/2018   ID:  Regina Herrera, DOB March 03, 1927, MRN [id number]  PCP:  Marco Collie, MD  Cardiologist:  Shirlee More, MD    Referring MD: Marco Collie, MD    ASSESSMENT:    1. Chronic diastolic heart Herrera ([place])   2. Hypertensive heart disease with heart Herrera ([place])   3. Coronary artery disease of native artery of native heart with stable angina pectoris ([place])   4. Iron deficiency anemia, unspecified iron deficiency anemia type    PLAN:    In order of problems listed above:  1. Regina Herrera is well compensated New York Heart Association class I with an intense effort including Regina PCP checking the heart Herrera vest oncology regarding anemia and good patient self-management Regina weight is stable vest runs 25 to 30% hemoglobin greater than 12 and no changes made is that if Regina weights go greater than 130 she will take an extra tablet of torsemide. 2. Stable blood pressure target continue current treatment 3. Stable CAD she has had no anginal discomfort with Regina current medical regimen continue current treatment including low-dose aspirin oral nitrate statin at this time I do not think she requires an ischemia evaluation 4. Stable improved hemoglobin last was normal   Next appointment: 6 months   Medication Adjustments/Labs and Tests Ordered: Current medicines are reviewed at length with the patient today.  Concerns regarding medicines are outlined above.  No orders of the defined types were placed in this encounter.  No orders of the defined types were placed in this encounter.   Chief Complaint  Patient presents with  . Follow-up  . Congestive Heart Herrera  . Coronary Artery Disease  . Hypertension  . Anemia    History of Present Illness:    Regina Herrera is a 85 y.o. female with a hx of heart Herrera, CAD, Dyslipidemia, HTN, S/P CABG,  03/13/17 S/P PCI  of Regina native RCA with Cypher DES 10/20/02 and chronic anemia  last  seen 03/29/18. Compliance with diet, lifestyle and medications: yes  Hgb stable 12.1 and heart Herrera vest < 30% both are stable  Sadly Regina Herrera recently died at age 82 she was the caregiver.  Overall Regina Herrera is doing well she has had no edema shortness of breath chest pain palpitations syncope follow closely every few weeks at Regina PCP office for a LifeVest determinations and sees oncology with a stable hemoglobin of 12.1. Past Medical History:  Diagnosis Date  . Anemia 05/08/2017  . CKD (chronic kidney disease) 05/08/2017  . Coronary artery disease involving native coronary artery of native heart 06/09/2015   Overview:   . DM (diabetes mellitus) ([place]) 05/08/2017  . Essential hypertension 06/09/2015  . Hyperlipidemia 06/09/2015   Overview:  poor statin tolerance  . Iron deficiency anemia 06/10/2015  . LVH (left ventricular hypertrophy) 06/10/2015  . PAF (paroxysmal atrial fibrillation) ([place]) 05/08/2017  . Sinus bradycardia 05/08/2017  . Syncope 05/08/2017    Past Surgical History:  Procedure Laterality Date  . CARDIAC CATHETERIZATION    . CHOLECYSTECTOMY    . CORONARY ANGIOPLASTY    . CORONARY ARTERY BYPASS GRAFT    . PARTIAL HYSTERECTOMY  1974    Current Medications: Current Meds  Medication Sig  . amLODipine (NORVASC) 10 MG tablet Take 10 mg by mouth daily.  . Ascorbic Acid (VITAMIN C) 100 MG tablet Take 100 mg by mouth daily.  Marland Kitchen aspirin 81 MG chewable tablet Chew 81  mg by mouth daily.  . cetirizine (ZYRTEC) 10 MG tablet Take 10 mg by mouth daily.  Marland Kitchen denosumab (PROLIA) 60 MG/ML SOLN injection Inject 1 Dose into the skin every 6 (six) months.  . ezetimibe (ZETIA) 10 MG tablet Take 10 mg by mouth daily.  . ferrous sulfate 325 (65 FE) MG tablet Take 325 mg by mouth daily with breakfast.  . gemfibrozil (LOPID) 600 MG tablet Take 600 mg by mouth 2 (two) times daily.  Marland Kitchen glimepiride (AMARYL) 1 MG tablet Take 0.5 mg by mouth daily.  . Insulin Detemir (LEVEMIR FLEXTOUCH) 100 UNIT/ML Pen  Inject 10 Units into the skin daily.  . isosorbide mononitrate (IMDUR) 30 MG 24 hr tablet Take 30 mg by mouth 3 (three) times daily.  . meclizine (ANTIVERT) 12.5 MG tablet Take 1 tablet by mouth every 8 (eight) hours as needed for dizziness.  . nitroGLYCERIN (NITROSTAT) 0.4 MG SL tablet Place 1 tablet (0.4 mg total) under the tongue every 5 (five) minutes as needed for chest pain.  . Omega-3 Fatty Acids (FISH OIL) 1000 MG CAPS Take 1 capsule by mouth 2 (two) times daily.  . pantoprazole (PROTONIX) 40 MG tablet Take 40 mg by mouth daily.  . pravastatin (PRAVACHOL) 10 MG tablet Take 1 tablet (10 mg total) by mouth 2 (two) times a week. (Patient taking differently: Take 10 mg by mouth 2 (two) times a week. Patient takes half a pill twice weekly, unsure of dose.)  . torsemide (DEMADEX) 20 MG tablet Take 1 tablet (20 mg total) by mouth 2 (two) times daily.     Allergies:   Ciprofloxacin; Lipitor [atorvastatin calcium]; and Codeine   Social History   Socioeconomic History  . Marital status: Widowed    Spouse name: Not on file  . Number of children: Not on file  . Years of education: Not on file  . Highest education level: Not on file  Occupational History  . Not on file  Social Needs  . Financial resource strain: Not on file  . Food insecurity:    Worry: Not on file    Inability: Not on file  . Transportation needs:    Medical: Not on file    Non-medical: Not on file  Tobacco Use  . Smoking status: Former Research scientist (life sciences)  . Smokeless tobacco: Never Used  Substance and Sexual Activity  . Alcohol use: No  . Drug use: Never  . Sexual activity: Not on file  Lifestyle  . Physical activity:    Days per week: Not on file    Minutes per session: Not on file  . Stress: Not on file  Relationships  . Social connections:    Talks on phone: Not on file    Gets together: Not on file    Attends religious service: Not on file    Active member of club or organization: Not on file    Attends meetings  of clubs or organizations: Not on file    Relationship status: Not on file  Other Topics Concern  . Not on file  Social History Narrative  . Not on file     Family History: The patient's family history includes Cancer in Regina brother; Heart attack in Regina father; Heart disease in Regina brother and Herrera; Stroke in Regina mother. ROS:   Please see the history of present illness.    All other systems reviewed and are negative.  EKGs/Labs/Other Studies Reviewed:    The following studies were reviewed today:  EKG:  EKG ordered today.  The ekg ordered today demonstrates sinus tachycardia old ASMI inferior T wave inversion unchanged 05/10/19  Recent Labs: No results found for requested labs within last 8760 hours.  Recent Lipid Panel No results found for: CHOL, TRIG, HDL, CHOLHDL, VLDL, LDLCALC, LDLDIRECT  Physical Exam:    VS:  BP (!) 144/72 (BP Location: Right Arm, Patient Position: Sitting, Cuff Size: Normal)   Pulse (!) 103   Ht 5\' 1"  (1.549 m)   Wt 133 lb 6 oz (60.5 kg)   SpO2 97%   BMI 25.20 kg/m     Wt Readings from Last 3 Encounters:  10/24/18 133 lb 6 oz (60.5 kg)  03/29/18 126 lb (57.2 kg)  01/28/18 126 lb (57.2 kg)     GEN:  Well nourished, well developed in no acute distress HEENT: Normal NECK: No JVD; No carotid bruits LYMPHATICS: No lymphadenopathy CARDIAC: RRR, no murmurs, rubs, gallops RESPIRATORY:  Clear to auscultation without rales, wheezing or rhonchi  ABDOMEN: Soft, non-tender, non-distended MUSCULOSKELETAL:  No edema; No deformity  SKIN: Warm and dry NEUROLOGIC:  Alert and oriented x 3 PSYCHIATRIC:  Normal affect    Signed, Shirlee More, MD  10/24/2018 10:05 AM    [place]

## 2018-10-24 ENCOUNTER — Encounter: Payer: Self-pay | Admitting: Cardiology

## 2018-10-24 ENCOUNTER — Ambulatory Visit (INDEPENDENT_AMBULATORY_CARE_PROVIDER_SITE_OTHER): Payer: PPO | Admitting: Cardiology

## 2018-10-24 VITALS — BP 144/72 | HR 103 | Ht 61.0 in | Wt 133.4 lb

## 2018-10-24 DIAGNOSIS — I110 Hypertensive heart disease with heart failure: Secondary | ICD-10-CM | POA: Diagnosis not present

## 2018-10-24 DIAGNOSIS — D509 Iron deficiency anemia, unspecified: Secondary | ICD-10-CM

## 2018-10-24 DIAGNOSIS — I5032 Chronic diastolic (congestive) heart failure: Secondary | ICD-10-CM | POA: Diagnosis not present

## 2018-10-24 DIAGNOSIS — I25118 Atherosclerotic heart disease of native coronary artery with other forms of angina pectoris: Secondary | ICD-10-CM

## 2018-10-24 MED ORDER — TORSEMIDE 20 MG PO TABS: ORAL_TABLET | ORAL | 6 refills | Status: DC

## 2018-10-24 MED ORDER — PRAVASTATIN SODIUM 10 MG PO TABS: 5.0000 mg | ORAL_TABLET | ORAL | 3 refills | Status: DC

## 2018-10-24 MED ORDER — NITROGLYCERIN 0.4 MG SL SUBL: 0.4000 mg | SUBLINGUAL_TABLET | SUBLINGUAL | 6 refills | Status: AC | PRN

## 2018-10-24 NOTE — Patient Instructions (Addendum)
Medication Instructions:  Your physician has recommended you make the following change in your medication:  Torsemide :take 1 tablet twice daily every day.  Take an extra 20mg  (40mg ) in the morning if your weight is 131 pounds or more.    If you need a refill on your cardiac medications before your next appointment, please call your pharmacy.   Lab work: NONE If you have labs (blood work) drawn today and your tests are completely normal, you will receive your results only by: Marland Kitchen MyChart Message (if you have MyChart) OR . A paper copy in the mail If you have any lab test that is abnormal or we need to change your treatment, we will call you to review the results.  Testing/Procedures: You had an EKG today  Follow-Up: At [facility], you and your health needs are our priority.  As part of our continuing mission to provide you with exceptional heart care, we have created designated Provider Care Teams.  These Care Teams include your primary Cardiologist (physician) and Advanced Practice Providers (APPs -  Physician Assistants and Nurse Practitioners) who all work together to provide you with the care you need, when you need it. You will need a follow up appointment in 6 months.  Please call our office 2 months in advance to schedule this appointment.

## 2018-10-24 NOTE — Addendum Note (Signed)
Addended by: Stevan Born on: 10/24/2018 11:49 AM   Modules accepted: Orders

## 2018-10-28 DIAGNOSIS — Z6823 Body mass index (BMI) 23.0-23.9, adult: Secondary | ICD-10-CM | POA: Diagnosis not present

## 2018-10-28 DIAGNOSIS — I5030 Unspecified diastolic (congestive) heart failure: Secondary | ICD-10-CM | POA: Diagnosis not present

## 2018-10-28 DIAGNOSIS — S8010XA Contusion of unspecified lower leg, initial encounter: Secondary | ICD-10-CM | POA: Diagnosis not present

## 2018-10-28 DIAGNOSIS — D638 Anemia in other chronic diseases classified elsewhere: Secondary | ICD-10-CM | POA: Diagnosis not present

## 2018-11-05 DIAGNOSIS — M810 Age-related osteoporosis without current pathological fracture: Secondary | ICD-10-CM | POA: Diagnosis not present

## 2018-11-15 DIAGNOSIS — D631 Anemia in chronic kidney disease: Secondary | ICD-10-CM | POA: Diagnosis not present

## 2018-11-15 DIAGNOSIS — I5032 Chronic diastolic (congestive) heart failure: Secondary | ICD-10-CM | POA: Diagnosis not present

## 2018-11-15 DIAGNOSIS — M810 Age-related osteoporosis without current pathological fracture: Secondary | ICD-10-CM | POA: Diagnosis not present

## 2018-11-15 DIAGNOSIS — I5030 Unspecified diastolic (congestive) heart failure: Secondary | ICD-10-CM | POA: Diagnosis not present

## 2018-11-18 DIAGNOSIS — I5032 Chronic diastolic (congestive) heart failure: Secondary | ICD-10-CM | POA: Diagnosis not present

## 2018-11-18 DIAGNOSIS — D631 Anemia in chronic kidney disease: Secondary | ICD-10-CM | POA: Diagnosis not present

## 2018-12-09 DIAGNOSIS — N184 Chronic kidney disease, stage 4 (severe): Secondary | ICD-10-CM | POA: Diagnosis not present

## 2018-12-09 DIAGNOSIS — E1169 Type 2 diabetes mellitus with other specified complication: Secondary | ICD-10-CM | POA: Diagnosis not present

## 2018-12-09 DIAGNOSIS — E1159 Type 2 diabetes mellitus with other circulatory complications: Secondary | ICD-10-CM | POA: Diagnosis not present

## 2018-12-16 DIAGNOSIS — E1159 Type 2 diabetes mellitus with other circulatory complications: Secondary | ICD-10-CM | POA: Diagnosis not present

## 2018-12-16 DIAGNOSIS — E1169 Type 2 diabetes mellitus with other specified complication: Secondary | ICD-10-CM | POA: Diagnosis not present

## 2018-12-16 DIAGNOSIS — I1310 Hypertensive heart and chronic kidney disease without heart failure, with stage 1 through stage 4 chronic kidney disease, or unspecified chronic kidney disease: Secondary | ICD-10-CM | POA: Diagnosis not present

## 2018-12-16 DIAGNOSIS — Z794 Long term (current) use of insulin: Secondary | ICD-10-CM | POA: Diagnosis not present

## 2018-12-17 DIAGNOSIS — E782 Mixed hyperlipidemia: Secondary | ICD-10-CM | POA: Diagnosis not present

## 2018-12-17 DIAGNOSIS — E1159 Type 2 diabetes mellitus with other circulatory complications: Secondary | ICD-10-CM | POA: Diagnosis not present

## 2018-12-17 DIAGNOSIS — I1310 Hypertensive heart and chronic kidney disease without heart failure, with stage 1 through stage 4 chronic kidney disease, or unspecified chronic kidney disease: Secondary | ICD-10-CM | POA: Diagnosis not present

## 2018-12-17 DIAGNOSIS — E1169 Type 2 diabetes mellitus with other specified complication: Secondary | ICD-10-CM | POA: Diagnosis not present

## 2018-12-30 DIAGNOSIS — N184 Chronic kidney disease, stage 4 (severe): Secondary | ICD-10-CM | POA: Diagnosis not present

## 2018-12-30 DIAGNOSIS — D638 Anemia in other chronic diseases classified elsewhere: Secondary | ICD-10-CM | POA: Diagnosis not present

## 2018-12-30 DIAGNOSIS — I4891 Unspecified atrial fibrillation: Secondary | ICD-10-CM | POA: Diagnosis not present

## 2018-12-30 DIAGNOSIS — I5032 Chronic diastolic (congestive) heart failure: Secondary | ICD-10-CM | POA: Diagnosis not present

## 2018-12-31 DIAGNOSIS — H0014 Chalazion left upper eyelid: Secondary | ICD-10-CM | POA: Diagnosis not present

## 2018-12-31 DIAGNOSIS — M25531 Pain in right wrist: Secondary | ICD-10-CM | POA: Diagnosis not present

## 2018-12-31 DIAGNOSIS — I5032 Chronic diastolic (congestive) heart failure: Secondary | ICD-10-CM | POA: Diagnosis not present

## 2018-12-31 DIAGNOSIS — M654 Radial styloid tenosynovitis [de Quervain]: Secondary | ICD-10-CM | POA: Diagnosis not present

## 2018-12-31 DIAGNOSIS — M4316 Spondylolisthesis, lumbar region: Secondary | ICD-10-CM | POA: Diagnosis not present

## 2018-12-31 DIAGNOSIS — M999 Biomechanical lesion, unspecified: Secondary | ICD-10-CM | POA: Diagnosis not present

## 2018-12-31 DIAGNOSIS — D509 Iron deficiency anemia, unspecified: Secondary | ICD-10-CM | POA: Diagnosis not present

## 2018-12-31 DIAGNOSIS — M25539 Pain in unspecified wrist: Secondary | ICD-10-CM | POA: Diagnosis not present

## 2019-01-13 DIAGNOSIS — I5032 Chronic diastolic (congestive) heart failure: Secondary | ICD-10-CM | POA: Diagnosis not present

## 2019-01-13 DIAGNOSIS — I1310 Hypertensive heart and chronic kidney disease without heart failure, with stage 1 through stage 4 chronic kidney disease, or unspecified chronic kidney disease: Secondary | ICD-10-CM | POA: Diagnosis not present

## 2019-01-16 DIAGNOSIS — I1310 Hypertensive heart and chronic kidney disease without heart failure, with stage 1 through stage 4 chronic kidney disease, or unspecified chronic kidney disease: Secondary | ICD-10-CM | POA: Diagnosis not present

## 2019-01-16 DIAGNOSIS — I5032 Chronic diastolic (congestive) heart failure: Secondary | ICD-10-CM | POA: Diagnosis not present

## 2019-01-16 DIAGNOSIS — N184 Chronic kidney disease, stage 4 (severe): Secondary | ICD-10-CM | POA: Diagnosis not present

## 2019-01-16 DIAGNOSIS — I4891 Unspecified atrial fibrillation: Secondary | ICD-10-CM | POA: Diagnosis not present

## 2019-01-23 DIAGNOSIS — N189 Chronic kidney disease, unspecified: Secondary | ICD-10-CM | POA: Diagnosis not present

## 2019-01-23 DIAGNOSIS — D631 Anemia in chronic kidney disease: Secondary | ICD-10-CM | POA: Diagnosis not present

## 2019-01-23 DIAGNOSIS — D509 Iron deficiency anemia, unspecified: Secondary | ICD-10-CM | POA: Diagnosis not present

## 2019-01-23 DIAGNOSIS — I509 Heart failure, unspecified: Secondary | ICD-10-CM | POA: Diagnosis not present

## 2019-01-23 DIAGNOSIS — E871 Hypo-osmolality and hyponatremia: Secondary | ICD-10-CM | POA: Diagnosis not present

## 2019-01-23 DIAGNOSIS — D638 Anemia in other chronic diseases classified elsewhere: Secondary | ICD-10-CM | POA: Diagnosis not present

## 2019-02-03 DIAGNOSIS — D692 Other nonthrombocytopenic purpura: Secondary | ICD-10-CM | POA: Diagnosis not present

## 2019-02-03 DIAGNOSIS — I5032 Chronic diastolic (congestive) heart failure: Secondary | ICD-10-CM | POA: Diagnosis not present

## 2019-02-03 DIAGNOSIS — Z7189 Other specified counseling: Secondary | ICD-10-CM | POA: Diagnosis not present

## 2019-02-03 DIAGNOSIS — D638 Anemia in other chronic diseases classified elsewhere: Secondary | ICD-10-CM | POA: Diagnosis not present

## 2019-02-14 DIAGNOSIS — N184 Chronic kidney disease, stage 4 (severe): Secondary | ICD-10-CM | POA: Diagnosis not present

## 2019-02-14 DIAGNOSIS — E1159 Type 2 diabetes mellitus with other circulatory complications: Secondary | ICD-10-CM | POA: Diagnosis not present

## 2019-02-14 DIAGNOSIS — D638 Anemia in other chronic diseases classified elsewhere: Secondary | ICD-10-CM | POA: Diagnosis not present

## 2019-02-14 DIAGNOSIS — I5032 Chronic diastolic (congestive) heart failure: Secondary | ICD-10-CM | POA: Diagnosis not present

## 2019-02-17 DIAGNOSIS — E1159 Type 2 diabetes mellitus with other circulatory complications: Secondary | ICD-10-CM | POA: Diagnosis not present

## 2019-02-17 DIAGNOSIS — D638 Anemia in other chronic diseases classified elsewhere: Secondary | ICD-10-CM | POA: Diagnosis not present

## 2019-02-17 DIAGNOSIS — Z794 Long term (current) use of insulin: Secondary | ICD-10-CM | POA: Diagnosis not present

## 2019-02-17 DIAGNOSIS — I5032 Chronic diastolic (congestive) heart failure: Secondary | ICD-10-CM | POA: Diagnosis not present

## 2019-02-27 DIAGNOSIS — H18221 Idiopathic corneal edema, right eye: Secondary | ICD-10-CM | POA: Diagnosis not present

## 2019-02-27 DIAGNOSIS — H40053 Ocular hypertension, bilateral: Secondary | ICD-10-CM | POA: Diagnosis not present

## 2019-02-27 DIAGNOSIS — H5201 Hypermetropia, right eye: Secondary | ICD-10-CM | POA: Diagnosis not present

## 2019-03-07 DIAGNOSIS — D638 Anemia in other chronic diseases classified elsewhere: Secondary | ICD-10-CM | POA: Diagnosis not present

## 2019-03-07 DIAGNOSIS — L309 Dermatitis, unspecified: Secondary | ICD-10-CM | POA: Diagnosis not present

## 2019-03-07 DIAGNOSIS — Z6824 Body mass index (BMI) 24.0-24.9, adult: Secondary | ICD-10-CM | POA: Diagnosis not present

## 2019-03-07 DIAGNOSIS — I5032 Chronic diastolic (congestive) heart failure: Secondary | ICD-10-CM | POA: Diagnosis not present

## 2019-03-11 DIAGNOSIS — E1169 Type 2 diabetes mellitus with other specified complication: Secondary | ICD-10-CM | POA: Diagnosis not present

## 2019-03-11 DIAGNOSIS — N184 Chronic kidney disease, stage 4 (severe): Secondary | ICD-10-CM | POA: Diagnosis not present

## 2019-03-11 DIAGNOSIS — E1159 Type 2 diabetes mellitus with other circulatory complications: Secondary | ICD-10-CM | POA: Diagnosis not present

## 2019-03-18 DIAGNOSIS — I5032 Chronic diastolic (congestive) heart failure: Secondary | ICD-10-CM | POA: Diagnosis not present

## 2019-03-18 DIAGNOSIS — E1159 Type 2 diabetes mellitus with other circulatory complications: Secondary | ICD-10-CM | POA: Diagnosis not present

## 2019-03-18 DIAGNOSIS — D638 Anemia in other chronic diseases classified elsewhere: Secondary | ICD-10-CM | POA: Diagnosis not present

## 2019-03-18 DIAGNOSIS — Z794 Long term (current) use of insulin: Secondary | ICD-10-CM | POA: Diagnosis not present

## 2019-03-24 DIAGNOSIS — Z794 Long term (current) use of insulin: Secondary | ICD-10-CM | POA: Diagnosis not present

## 2019-03-24 DIAGNOSIS — E1159 Type 2 diabetes mellitus with other circulatory complications: Secondary | ICD-10-CM | POA: Diagnosis not present

## 2019-03-24 DIAGNOSIS — I5032 Chronic diastolic (congestive) heart failure: Secondary | ICD-10-CM | POA: Diagnosis not present

## 2019-03-24 DIAGNOSIS — E1169 Type 2 diabetes mellitus with other specified complication: Secondary | ICD-10-CM | POA: Diagnosis not present

## 2019-03-24 DIAGNOSIS — I1310 Hypertensive heart and chronic kidney disease without heart failure, with stage 1 through stage 4 chronic kidney disease, or unspecified chronic kidney disease: Secondary | ICD-10-CM | POA: Diagnosis not present

## 2019-04-14 DIAGNOSIS — I5032 Chronic diastolic (congestive) heart failure: Secondary | ICD-10-CM | POA: Diagnosis not present

## 2019-04-14 DIAGNOSIS — D509 Iron deficiency anemia, unspecified: Secondary | ICD-10-CM | POA: Diagnosis not present

## 2019-04-14 DIAGNOSIS — Z6823 Body mass index (BMI) 23.0-23.9, adult: Secondary | ICD-10-CM | POA: Diagnosis not present

## 2019-04-16 DIAGNOSIS — S0003XA Contusion of scalp, initial encounter: Secondary | ICD-10-CM | POA: Diagnosis not present

## 2019-04-16 DIAGNOSIS — S0083XA Contusion of other part of head, initial encounter: Secondary | ICD-10-CM | POA: Diagnosis not present

## 2019-04-16 DIAGNOSIS — S60212A Contusion of left wrist, initial encounter: Secondary | ICD-10-CM | POA: Diagnosis not present

## 2019-04-16 DIAGNOSIS — S0001XA Abrasion of scalp, initial encounter: Secondary | ICD-10-CM | POA: Diagnosis not present

## 2019-04-16 DIAGNOSIS — S6992XA Unspecified injury of left wrist, hand and finger(s), initial encounter: Secondary | ICD-10-CM | POA: Diagnosis not present

## 2019-04-16 DIAGNOSIS — M25532 Pain in left wrist: Secondary | ICD-10-CM | POA: Diagnosis not present

## 2019-04-16 DIAGNOSIS — M7989 Other specified soft tissue disorders: Secondary | ICD-10-CM | POA: Diagnosis not present

## 2019-04-16 DIAGNOSIS — S60811A Abrasion of right wrist, initial encounter: Secondary | ICD-10-CM | POA: Diagnosis not present

## 2019-04-16 DIAGNOSIS — S60211A Contusion of right wrist, initial encounter: Secondary | ICD-10-CM | POA: Diagnosis not present

## 2019-04-16 DIAGNOSIS — Z23 Encounter for immunization: Secondary | ICD-10-CM | POA: Diagnosis not present

## 2019-04-16 DIAGNOSIS — M25531 Pain in right wrist: Secondary | ICD-10-CM | POA: Diagnosis not present

## 2019-04-18 DIAGNOSIS — E782 Mixed hyperlipidemia: Secondary | ICD-10-CM | POA: Diagnosis not present

## 2019-04-18 DIAGNOSIS — I5032 Chronic diastolic (congestive) heart failure: Secondary | ICD-10-CM | POA: Diagnosis not present

## 2019-04-18 DIAGNOSIS — I1310 Hypertensive heart and chronic kidney disease without heart failure, with stage 1 through stage 4 chronic kidney disease, or unspecified chronic kidney disease: Secondary | ICD-10-CM | POA: Diagnosis not present

## 2019-04-18 DIAGNOSIS — E1159 Type 2 diabetes mellitus with other circulatory complications: Secondary | ICD-10-CM | POA: Diagnosis not present

## 2019-04-22 ENCOUNTER — Ambulatory Visit: Payer: PPO | Admitting: Cardiology

## 2019-04-28 DIAGNOSIS — S60211A Contusion of right wrist, initial encounter: Secondary | ICD-10-CM | POA: Diagnosis not present

## 2019-04-28 DIAGNOSIS — D509 Iron deficiency anemia, unspecified: Secondary | ICD-10-CM | POA: Diagnosis not present

## 2019-04-28 DIAGNOSIS — S60212A Contusion of left wrist, initial encounter: Secondary | ICD-10-CM | POA: Diagnosis not present

## 2019-04-28 DIAGNOSIS — S0083XA Contusion of other part of head, initial encounter: Secondary | ICD-10-CM | POA: Diagnosis not present

## 2019-04-28 DIAGNOSIS — Z9181 History of falling: Secondary | ICD-10-CM | POA: Diagnosis not present

## 2019-05-05 DIAGNOSIS — I5030 Unspecified diastolic (congestive) heart failure: Secondary | ICD-10-CM | POA: Diagnosis not present

## 2019-05-05 DIAGNOSIS — D631 Anemia in chronic kidney disease: Secondary | ICD-10-CM | POA: Diagnosis not present

## 2019-05-05 DIAGNOSIS — N189 Chronic kidney disease, unspecified: Secondary | ICD-10-CM | POA: Diagnosis not present

## 2019-05-05 DIAGNOSIS — D509 Iron deficiency anemia, unspecified: Secondary | ICD-10-CM | POA: Diagnosis not present

## 2019-05-07 DIAGNOSIS — D649 Anemia, unspecified: Secondary | ICD-10-CM | POA: Diagnosis not present

## 2019-05-07 DIAGNOSIS — E119 Type 2 diabetes mellitus without complications: Secondary | ICD-10-CM | POA: Diagnosis not present

## 2019-05-07 DIAGNOSIS — D638 Anemia in other chronic diseases classified elsewhere: Secondary | ICD-10-CM | POA: Diagnosis not present

## 2019-05-09 DIAGNOSIS — D638 Anemia in other chronic diseases classified elsewhere: Secondary | ICD-10-CM | POA: Diagnosis not present

## 2019-05-09 DIAGNOSIS — E119 Type 2 diabetes mellitus without complications: Secondary | ICD-10-CM | POA: Diagnosis not present

## 2019-05-09 DIAGNOSIS — Z1211 Encounter for screening for malignant neoplasm of colon: Secondary | ICD-10-CM | POA: Diagnosis not present

## 2019-05-13 DIAGNOSIS — H18223 Idiopathic corneal edema, bilateral: Secondary | ICD-10-CM | POA: Diagnosis not present

## 2019-05-13 DIAGNOSIS — H53002 Unspecified amblyopia, left eye: Secondary | ICD-10-CM | POA: Diagnosis not present

## 2019-05-13 DIAGNOSIS — H40053 Ocular hypertension, bilateral: Secondary | ICD-10-CM | POA: Diagnosis not present

## 2019-05-13 DIAGNOSIS — Z961 Presence of intraocular lens: Secondary | ICD-10-CM | POA: Diagnosis not present

## 2019-05-13 NOTE — Progress Notes (Signed)
Cardiology Office Note:    Date:  05/14/2019   ID:  Regina Herrera, DOB January 01, 1927, MRN [id number]  PCP:  Marco Collie, MD  Cardiologist:  Shirlee More, MD    Referring MD: Marco Collie, MD    ASSESSMENT:    1. Coronary artery disease of native artery of native heart with stable angina pectoris ([place])   2. Chronic diastolic heart failure ([place])   3. Hypertensive heart disease with heart failure ([place])   4. Hyperlipidemia, unspecified hyperlipidemia type   5. Stage 3 chronic kidney disease ([place])   6. PAF (paroxysmal atrial fibrillation) ([facility])    PLAN:    In order of problems listed above:  1. I think the central problem here is anemia at her request I will check a CBC with copies to her PCP and to oncology and told her she may require further transfusion and/or IV iron.  Her heart failure is compensated she has no fluid overload her vest readings have been low she has no edema but with her CKD I will check a proBNP level and BMP today.  Hypertension hyperlipidemia are stable continue current treatment I will plan to see her routinely in 6 months   Next appointment: 6 months   Medication Adjustments/Labs and Tests Ordered: Current medicines are reviewed at length with the patient today.  Concerns regarding medicines are outlined above.  No orders of the defined types were placed in this encounter.  No orders of the defined types were placed in this encounter.   No chief complaint on file.   History of Present Illness:    Regina Herrera is a 85 y.o. female with a hx of heart failure, CAD, Dyslipidemia, HTN, S/P CABG,  03/13/17 S/P PCI  of her native RCA with Cypher DES 10/20/02 and chronic anemia last seen 10/24/2018. Compliance with diet, lifestyle and medications: Yes  She is not doing well complains bitterly of being weak fatigued exertional shortness of breath but compliant with her diuretics her weight is down 1 to 2 pounds but she is been anemic hemoglobin less than 8  hematest positive stool has been transfused and has arrangements to follow-up with Dr. Hinton Rao she requested me to check labs today.  There is nothing available for me to pull from [facility].  She has had no angina orthopnea chest pain palpitation or syncope she has fallen once and she is very anxious and apprehensive about her exercise intolerance. Past Medical History:  Diagnosis Date  . Anemia 05/08/2017  . CKD (chronic kidney disease) 05/08/2017  . Coronary artery disease involving native coronary artery of native heart 06/09/2015   Overview:   . DM (diabetes mellitus) ([place]) 05/08/2017  . Essential hypertension 06/09/2015  . Hyperlipidemia 06/09/2015   Overview:  poor statin tolerance  . Iron deficiency anemia 06/10/2015  . LVH (left ventricular hypertrophy) 06/10/2015  . PAF (paroxysmal atrial fibrillation) ([place]) 05/08/2017  . Sinus bradycardia 05/08/2017  . Syncope 05/08/2017    Past Surgical History:  Procedure Laterality Date  . CARDIAC CATHETERIZATION    . CHOLECYSTECTOMY    . CORONARY ANGIOPLASTY    . CORONARY ARTERY BYPASS GRAFT    . PARTIAL HYSTERECTOMY  1974    Current Medications: Current Meds  Medication Sig  . amLODipine (NORVASC) 10 MG tablet Take 10 mg by mouth daily.  . Ascorbic Acid (VITAMIN C) 100 MG tablet Take 100 mg by mouth daily.  Marland Kitchen aspirin 81 MG chewable tablet Chew 81 mg by mouth daily.  Marland Kitchen  cetirizine (ZYRTEC) 10 MG tablet Take 10 mg by mouth daily.  Marland Kitchen denosumab (PROLIA) 60 MG/ML SOLN injection Inject 1 Dose into the skin every 6 (six) months.  . ezetimibe (ZETIA) 10 MG tablet Take 10 mg by mouth daily.  . ferrous sulfate 325 (65 FE) MG tablet Take 325 mg by mouth daily with breakfast.  . gemfibrozil (LOPID) 600 MG tablet Take 600 mg by mouth 2 (two) times daily.  Marland Kitchen glimepiride (AMARYL) 1 MG tablet Take 1 mg by mouth daily.   . Insulin Detemir (LEVEMIR FLEXTOUCH) 100 UNIT/ML Pen Inject 10 Units into the skin daily.  . isosorbide mononitrate (IMDUR) 30 MG 24 hr  tablet Take 30 mg by mouth 3 (three) times daily.  . meclizine (ANTIVERT) 12.5 MG tablet Take 1 tablet by mouth every 8 (eight) hours as needed for dizziness.  . nitroGLYCERIN (NITROSTAT) 0.4 MG SL tablet Place 1 tablet (0.4 mg total) under the tongue every 5 (five) minutes as needed for chest pain.  . Omega-3 Fatty Acids (FISH OIL) 1000 MG CAPS Take 1 capsule by mouth daily.   . pantoprazole (PROTONIX) 40 MG tablet Take 40 mg by mouth daily.  . pravastatin (PRAVACHOL) 10 MG tablet Take 0.5 tablets (5 mg total) by mouth 2 (two) times a week.  . torsemide (DEMADEX) 20 MG tablet Take 1 tablet twice daily.  Take an extra 20mg  (40mg ) in the morning if your weight is 131 pounds or more.     Allergies:   Ciprofloxacin, Lipitor [atorvastatin calcium], and Codeine   Social History   Socioeconomic History  . Marital status: Widowed    Spouse name: Not on file  . Number of children: Not on file  . Years of education: Not on file  . Highest education level: Not on file  Occupational History  . Not on file  Social Needs  . Financial resource strain: Not on file  . Food insecurity    Worry: Not on file    Inability: Not on file  . Transportation needs    Medical: Not on file    Non-medical: Not on file  Tobacco Use  . Smoking status: Former Research scientist (life sciences)  . Smokeless tobacco: Never Used  Substance and Sexual Activity  . Alcohol use: No  . Drug use: Never  . Sexual activity: Not on file  Lifestyle  . Physical activity    Days per week: Not on file    Minutes per session: Not on file  . Stress: Not on file  Relationships  . Social Herbalist on phone: Not on file    Gets together: Not on file    Attends religious service: Not on file    Active member of club or organization: Not on file    Attends meetings of clubs or organizations: Not on file    Relationship status: Not on file  Other Topics Concern  . Not on file  Social History Narrative  . Not on file     Family  History: The patient's family history includes Cancer in her brother; Heart attack in her father; Heart disease in her brother and sister; Stroke in her mother. ROS:   Please see the history of present illness.    All other systems reviewed and are negative.  EKGs/Labs/Other Studies Reviewed:    The following studies were reviewed today:   Recent Labs: No results found for requested labs within last 8760 hours.  Recent Lipid Panel No results found for: CHOL,  TRIG, HDL, CHOLHDL, VLDL, LDLCALC, LDLDIRECT  Physical Exam:    VS:  BP (!) 118/48 (BP Location: Right Arm, Patient Position: Sitting, Cuff Size: Normal)   Ht 5\' 1"  (1.549 m)   Wt 128 lb (58.1 kg)   SpO2 98%   BMI 24.19 kg/m     Wt Readings from Last 3 Encounters:  05/14/19 128 lb (58.1 kg)  10/24/18 133 lb 6 oz (60.5 kg)  03/29/18 126 lb (57.2 kg)     GEN: Marked pallor the skin and membranes very anxious well nourished, well developed in no acute distress HEENT: Normal NECK: No JVD; No carotid bruits LYMPHATICS: No lymphadenopathy CARDIAC: RRR, no murmurs, rubs, gallops RESPIRATORY:  Clear to auscultation without rales, wheezing or rhonchi  ABDOMEN: Soft, non-tender, non-distended MUSCULOSKELETAL:  No edema; No deformity  SKIN: Warm and dry NEUROLOGIC:  Alert and oriented x 3 PSYCHIATRIC:  Normal affect    Signed, Shirlee More, MD  05/14/2019 2:04 PM    [place]

## 2019-05-14 ENCOUNTER — Encounter: Payer: Self-pay | Admitting: Cardiology

## 2019-05-14 ENCOUNTER — Ambulatory Visit (INDEPENDENT_AMBULATORY_CARE_PROVIDER_SITE_OTHER): Payer: PPO | Admitting: Cardiology

## 2019-05-14 ENCOUNTER — Other Ambulatory Visit: Payer: Self-pay

## 2019-05-14 VITALS — BP 118/48 | Ht 61.0 in | Wt 128.0 lb

## 2019-05-14 DIAGNOSIS — I480 Paroxysmal atrial fibrillation: Secondary | ICD-10-CM | POA: Diagnosis not present

## 2019-05-14 DIAGNOSIS — N183 Chronic kidney disease, stage 3 unspecified: Secondary | ICD-10-CM

## 2019-05-14 DIAGNOSIS — I110 Hypertensive heart disease with heart failure: Secondary | ICD-10-CM | POA: Diagnosis not present

## 2019-05-14 DIAGNOSIS — D649 Anemia, unspecified: Secondary | ICD-10-CM | POA: Diagnosis not present

## 2019-05-14 DIAGNOSIS — E785 Hyperlipidemia, unspecified: Secondary | ICD-10-CM

## 2019-05-14 DIAGNOSIS — I25118 Atherosclerotic heart disease of native coronary artery with other forms of angina pectoris: Secondary | ICD-10-CM

## 2019-05-14 DIAGNOSIS — I5032 Chronic diastolic (congestive) heart failure: Secondary | ICD-10-CM

## 2019-05-14 MED ORDER — TORSEMIDE 20 MG PO TABS: ORAL_TABLET | ORAL | 0 refills | Status: DC

## 2019-05-14 NOTE — Patient Instructions (Signed)
Medication Instructions:  Your physician has recommended you make the following change in your medication:   INCREASE torsemide (demadex) 20 mg: Take 1 tablet twice daily. Take 1 extra tablet every other day.   If you need a refill on your cardiac medications before your next appointment, please call your pharmacy.   Lab work: Your physician recommends that you return for lab work today: CBC, BMP, ProBNP.   If you have labs (blood work) drawn today and your tests are completely normal, you will receive your results only by: Marland Kitchen MyChart Message (if you have MyChart) OR . A paper copy in the mail If you have any lab test that is abnormal or we need to change your treatment, we will call you to review the results.  Testing/Procedures: None  Follow-Up: At [facility], you and your health needs are our priority.  As part of our continuing mission to provide you with exceptional heart care, we have created designated Provider Care Teams.  These Care Teams include your primary Cardiologist (physician) and Advanced Practice Providers (APPs -  Physician Assistants and Nurse Practitioners) who all work together to provide you with the care you need, when you need it. You will need a follow up appointment in 6 months.  Please call our office 2 months in advance to schedule this appointment.

## 2019-05-14 NOTE — Addendum Note (Signed)
Addended by: Austin Miles on: 05/14/2019 02:15 PM   Modules accepted: Orders

## 2019-05-15 DIAGNOSIS — N182 Chronic kidney disease, stage 2 (mild): Secondary | ICD-10-CM | POA: Diagnosis not present

## 2019-05-15 DIAGNOSIS — D631 Anemia in chronic kidney disease: Secondary | ICD-10-CM | POA: Diagnosis not present

## 2019-05-15 LAB — CBC
Hematocrit: 22.5 % — ABNORMAL LOW (ref 34.0–46.6)
Hemoglobin: 7.2 g/dL — ABNORMAL LOW (ref 11.1–15.9)
MCH: 30.3 pg (ref 26.6–33.0)
MCHC: 32 g/dL (ref 31.5–35.7)
MCV: 95 fL (ref 79–97)
NRBC: 1 % — ABNORMAL HIGH (ref 0–0)
Platelets: 252 10*3/uL (ref 150–450)
RBC: 2.38 x10E6/uL — CL (ref 3.77–5.28)
RDW: 15.2 % (ref 11.7–15.4)
WBC: 5.7 10*3/uL (ref 3.4–10.8)

## 2019-05-15 LAB — BASIC METABOLIC PANEL
BUN/Creatinine Ratio: 27 (ref 12–28)
BUN: 53 mg/dL — ABNORMAL HIGH (ref 10–36)
CO2: 18 mmol/L — ABNORMAL LOW (ref 20–29)
Calcium: 9.8 mg/dL (ref 8.7–10.3)
Chloride: 100 mmol/L (ref 96–106)
Creatinine, Ser: 1.96 mg/dL — ABNORMAL HIGH (ref 0.57–1.00)
GFR calc Af Amer: 25 mL/min/{1.73_m2} — ABNORMAL LOW (ref >59)
GFR calc non Af Amer: 22 mL/min/{1.73_m2} — ABNORMAL LOW (ref >59)
Glucose: 417 mg/dL — ABNORMAL HIGH (ref 65–99)
Potassium: 4.3 mmol/L (ref 3.5–5.2)
Sodium: 138 mmol/L (ref 134–144)

## 2019-05-15 LAB — PRO B NATRIURETIC PEPTIDE: NT-Pro BNP: 1007 pg/mL — ABNORMAL HIGH (ref 0–738)

## 2019-05-19 DIAGNOSIS — D638 Anemia in other chronic diseases classified elsewhere: Secondary | ICD-10-CM | POA: Diagnosis not present

## 2019-05-19 DIAGNOSIS — G4700 Insomnia, unspecified: Secondary | ICD-10-CM | POA: Diagnosis not present

## 2019-05-19 DIAGNOSIS — E119 Type 2 diabetes mellitus without complications: Secondary | ICD-10-CM | POA: Diagnosis not present

## 2019-05-19 DIAGNOSIS — D631 Anemia in chronic kidney disease: Secondary | ICD-10-CM | POA: Diagnosis not present

## 2019-05-19 DIAGNOSIS — Z6822 Body mass index (BMI) 22.0-22.9, adult: Secondary | ICD-10-CM | POA: Diagnosis not present

## 2019-05-19 DIAGNOSIS — D509 Iron deficiency anemia, unspecified: Secondary | ICD-10-CM | POA: Diagnosis not present

## 2019-05-19 DIAGNOSIS — I5030 Unspecified diastolic (congestive) heart failure: Secondary | ICD-10-CM | POA: Diagnosis not present

## 2019-05-19 DIAGNOSIS — N189 Chronic kidney disease, unspecified: Secondary | ICD-10-CM | POA: Diagnosis not present

## 2019-05-19 DIAGNOSIS — I5032 Chronic diastolic (congestive) heart failure: Secondary | ICD-10-CM | POA: Diagnosis not present

## 2019-05-19 DIAGNOSIS — N390 Urinary tract infection, site not specified: Secondary | ICD-10-CM | POA: Diagnosis not present

## 2019-05-20 DIAGNOSIS — E119 Type 2 diabetes mellitus without complications: Secondary | ICD-10-CM | POA: Diagnosis not present

## 2019-05-20 DIAGNOSIS — D638 Anemia in other chronic diseases classified elsewhere: Secondary | ICD-10-CM | POA: Diagnosis not present

## 2019-05-22 DIAGNOSIS — E119 Type 2 diabetes mellitus without complications: Secondary | ICD-10-CM | POA: Diagnosis not present

## 2019-05-22 DIAGNOSIS — D638 Anemia in other chronic diseases classified elsewhere: Secondary | ICD-10-CM | POA: Diagnosis not present

## 2019-05-26 DIAGNOSIS — K922 Gastrointestinal hemorrhage, unspecified: Secondary | ICD-10-CM | POA: Diagnosis not present

## 2019-05-26 DIAGNOSIS — D649 Anemia, unspecified: Secondary | ICD-10-CM | POA: Diagnosis not present

## 2019-05-26 DIAGNOSIS — M19041 Primary osteoarthritis, right hand: Secondary | ICD-10-CM | POA: Diagnosis not present

## 2019-05-26 DIAGNOSIS — D631 Anemia in chronic kidney disease: Secondary | ICD-10-CM | POA: Diagnosis not present

## 2019-05-26 DIAGNOSIS — R0600 Dyspnea, unspecified: Secondary | ICD-10-CM | POA: Diagnosis not present

## 2019-05-26 DIAGNOSIS — Z885 Allergy status to narcotic agent status: Secondary | ICD-10-CM | POA: Diagnosis not present

## 2019-05-26 DIAGNOSIS — I2510 Atherosclerotic heart disease of native coronary artery without angina pectoris: Secondary | ICD-10-CM | POA: Diagnosis not present

## 2019-05-26 DIAGNOSIS — Z881 Allergy status to other antibiotic agents status: Secondary | ICD-10-CM | POA: Diagnosis not present

## 2019-05-26 DIAGNOSIS — R195 Other fecal abnormalities: Secondary | ICD-10-CM | POA: Diagnosis not present

## 2019-05-26 DIAGNOSIS — R7989 Other specified abnormal findings of blood chemistry: Secondary | ICD-10-CM | POA: Diagnosis not present

## 2019-05-26 DIAGNOSIS — I5032 Chronic diastolic (congestive) heart failure: Secondary | ICD-10-CM | POA: Diagnosis not present

## 2019-05-26 DIAGNOSIS — Z951 Presence of aortocoronary bypass graft: Secondary | ICD-10-CM | POA: Diagnosis not present

## 2019-05-26 DIAGNOSIS — I130 Hypertensive heart and chronic kidney disease with heart failure and stage 1 through stage 4 chronic kidney disease, or unspecified chronic kidney disease: Secondary | ICD-10-CM | POA: Diagnosis not present

## 2019-05-26 DIAGNOSIS — Z79899 Other long term (current) drug therapy: Secondary | ICD-10-CM | POA: Diagnosis not present

## 2019-05-26 DIAGNOSIS — N189 Chronic kidney disease, unspecified: Secondary | ICD-10-CM | POA: Diagnosis not present

## 2019-05-26 DIAGNOSIS — I10 Essential (primary) hypertension: Secondary | ICD-10-CM | POA: Diagnosis not present

## 2019-05-26 DIAGNOSIS — E1122 Type 2 diabetes mellitus with diabetic chronic kidney disease: Secondary | ICD-10-CM | POA: Diagnosis not present

## 2019-05-26 DIAGNOSIS — E1165 Type 2 diabetes mellitus with hyperglycemia: Secondary | ICD-10-CM | POA: Diagnosis not present

## 2019-05-26 DIAGNOSIS — D62 Acute posthemorrhagic anemia: Secondary | ICD-10-CM | POA: Diagnosis not present

## 2019-05-26 DIAGNOSIS — E7800 Pure hypercholesterolemia, unspecified: Secondary | ICD-10-CM | POA: Diagnosis not present

## 2019-05-26 DIAGNOSIS — Z7982 Long term (current) use of aspirin: Secondary | ICD-10-CM | POA: Diagnosis not present

## 2019-05-26 DIAGNOSIS — K449 Diaphragmatic hernia without obstruction or gangrene: Secondary | ICD-10-CM | POA: Diagnosis not present

## 2019-05-26 DIAGNOSIS — R531 Weakness: Secondary | ICD-10-CM | POA: Diagnosis not present

## 2019-05-26 DIAGNOSIS — Z794 Long term (current) use of insulin: Secondary | ICD-10-CM | POA: Diagnosis not present

## 2019-05-26 DIAGNOSIS — M19042 Primary osteoarthritis, left hand: Secondary | ICD-10-CM | POA: Diagnosis not present

## 2019-05-26 DIAGNOSIS — Z87891 Personal history of nicotine dependence: Secondary | ICD-10-CM | POA: Diagnosis not present

## 2019-05-26 DIAGNOSIS — K219 Gastro-esophageal reflux disease without esophagitis: Secondary | ICD-10-CM | POA: Diagnosis not present

## 2019-05-26 DIAGNOSIS — I509 Heart failure, unspecified: Secondary | ICD-10-CM | POA: Diagnosis not present

## 2019-05-26 DIAGNOSIS — R0602 Shortness of breath: Secondary | ICD-10-CM | POA: Diagnosis not present

## 2019-05-26 DIAGNOSIS — K921 Melena: Secondary | ICD-10-CM | POA: Diagnosis not present

## 2019-05-26 DIAGNOSIS — I252 Old myocardial infarction: Secondary | ICD-10-CM | POA: Diagnosis not present

## 2019-05-27 DIAGNOSIS — R0602 Shortness of breath: Secondary | ICD-10-CM

## 2019-05-27 DIAGNOSIS — K921 Melena: Secondary | ICD-10-CM | POA: Diagnosis not present

## 2019-05-27 DIAGNOSIS — R7989 Other specified abnormal findings of blood chemistry: Secondary | ICD-10-CM

## 2019-05-27 DIAGNOSIS — D62 Acute posthemorrhagic anemia: Secondary | ICD-10-CM | POA: Diagnosis not present

## 2019-05-27 DIAGNOSIS — D649 Anemia, unspecified: Secondary | ICD-10-CM

## 2019-05-27 DIAGNOSIS — R195 Other fecal abnormalities: Secondary | ICD-10-CM | POA: Diagnosis not present

## 2019-05-28 DIAGNOSIS — D62 Acute posthemorrhagic anemia: Secondary | ICD-10-CM | POA: Diagnosis not present

## 2019-05-28 DIAGNOSIS — D649 Anemia, unspecified: Secondary | ICD-10-CM | POA: Diagnosis not present

## 2019-05-29 DIAGNOSIS — E119 Type 2 diabetes mellitus without complications: Secondary | ICD-10-CM | POA: Diagnosis not present

## 2019-05-29 DIAGNOSIS — D638 Anemia in other chronic diseases classified elsewhere: Secondary | ICD-10-CM | POA: Diagnosis not present

## 2019-06-02 DIAGNOSIS — K635 Polyp of colon: Secondary | ICD-10-CM | POA: Diagnosis not present

## 2019-06-02 DIAGNOSIS — Z951 Presence of aortocoronary bypass graft: Secondary | ICD-10-CM | POA: Diagnosis not present

## 2019-06-02 DIAGNOSIS — D638 Anemia in other chronic diseases classified elsewhere: Secondary | ICD-10-CM | POA: Diagnosis not present

## 2019-06-02 DIAGNOSIS — D509 Iron deficiency anemia, unspecified: Secondary | ICD-10-CM | POA: Diagnosis not present

## 2019-06-02 DIAGNOSIS — K5730 Diverticulosis of large intestine without perforation or abscess without bleeding: Secondary | ICD-10-CM | POA: Diagnosis not present

## 2019-06-02 DIAGNOSIS — K6381 Dieulafoy lesion of intestine: Secondary | ICD-10-CM | POA: Diagnosis not present

## 2019-06-02 DIAGNOSIS — I509 Heart failure, unspecified: Secondary | ICD-10-CM | POA: Diagnosis not present

## 2019-06-02 DIAGNOSIS — K648 Other hemorrhoids: Secondary | ICD-10-CM | POA: Diagnosis not present

## 2019-06-02 DIAGNOSIS — Z955 Presence of coronary angioplasty implant and graft: Secondary | ICD-10-CM | POA: Diagnosis not present

## 2019-06-02 DIAGNOSIS — Z9049 Acquired absence of other specified parts of digestive tract: Secondary | ICD-10-CM | POA: Diagnosis not present

## 2019-06-02 DIAGNOSIS — E119 Type 2 diabetes mellitus without complications: Secondary | ICD-10-CM | POA: Diagnosis not present

## 2019-06-02 DIAGNOSIS — I10 Essential (primary) hypertension: Secondary | ICD-10-CM | POA: Diagnosis not present

## 2019-06-02 DIAGNOSIS — D649 Anemia, unspecified: Secondary | ICD-10-CM | POA: Diagnosis not present

## 2019-06-03 DIAGNOSIS — R195 Other fecal abnormalities: Secondary | ICD-10-CM | POA: Diagnosis not present

## 2019-06-03 DIAGNOSIS — K921 Melena: Secondary | ICD-10-CM | POA: Diagnosis not present

## 2019-06-03 DIAGNOSIS — K449 Diaphragmatic hernia without obstruction or gangrene: Secondary | ICD-10-CM | POA: Diagnosis not present

## 2019-06-03 DIAGNOSIS — D500 Iron deficiency anemia secondary to blood loss (chronic): Secondary | ICD-10-CM | POA: Diagnosis not present

## 2019-06-05 DIAGNOSIS — R7989 Other specified abnormal findings of blood chemistry: Secondary | ICD-10-CM | POA: Diagnosis not present

## 2019-06-05 DIAGNOSIS — N189 Chronic kidney disease, unspecified: Secondary | ICD-10-CM | POA: Diagnosis not present

## 2019-06-05 DIAGNOSIS — E119 Type 2 diabetes mellitus without complications: Secondary | ICD-10-CM | POA: Diagnosis not present

## 2019-06-05 DIAGNOSIS — D631 Anemia in chronic kidney disease: Secondary | ICD-10-CM | POA: Diagnosis not present

## 2019-06-05 DIAGNOSIS — D509 Iron deficiency anemia, unspecified: Secondary | ICD-10-CM | POA: Diagnosis not present

## 2019-06-05 DIAGNOSIS — I5032 Chronic diastolic (congestive) heart failure: Secondary | ICD-10-CM | POA: Diagnosis not present

## 2019-06-05 DIAGNOSIS — D638 Anemia in other chronic diseases classified elsewhere: Secondary | ICD-10-CM | POA: Diagnosis not present

## 2019-06-05 DIAGNOSIS — R300 Dysuria: Secondary | ICD-10-CM | POA: Diagnosis not present

## 2019-06-09 DIAGNOSIS — E119 Type 2 diabetes mellitus without complications: Secondary | ICD-10-CM | POA: Diagnosis not present

## 2019-06-09 DIAGNOSIS — D638 Anemia in other chronic diseases classified elsewhere: Secondary | ICD-10-CM | POA: Diagnosis not present

## 2019-06-17 DIAGNOSIS — E119 Type 2 diabetes mellitus without complications: Secondary | ICD-10-CM | POA: Diagnosis not present

## 2019-06-17 DIAGNOSIS — D638 Anemia in other chronic diseases classified elsewhere: Secondary | ICD-10-CM | POA: Diagnosis not present

## 2019-06-18 DIAGNOSIS — N189 Chronic kidney disease, unspecified: Secondary | ICD-10-CM | POA: Diagnosis not present

## 2019-06-18 DIAGNOSIS — I4891 Unspecified atrial fibrillation: Secondary | ICD-10-CM | POA: Diagnosis not present

## 2019-06-18 DIAGNOSIS — D631 Anemia in chronic kidney disease: Secondary | ICD-10-CM | POA: Diagnosis not present

## 2019-06-18 DIAGNOSIS — I5032 Chronic diastolic (congestive) heart failure: Secondary | ICD-10-CM | POA: Diagnosis not present

## 2019-06-26 DIAGNOSIS — Z794 Long term (current) use of insulin: Secondary | ICD-10-CM | POA: Diagnosis not present

## 2019-06-26 DIAGNOSIS — Z23 Encounter for immunization: Secondary | ICD-10-CM | POA: Diagnosis not present

## 2019-06-26 DIAGNOSIS — E1159 Type 2 diabetes mellitus with other circulatory complications: Secondary | ICD-10-CM | POA: Diagnosis not present

## 2019-06-26 DIAGNOSIS — I5030 Unspecified diastolic (congestive) heart failure: Secondary | ICD-10-CM | POA: Diagnosis not present

## 2019-06-26 DIAGNOSIS — D509 Iron deficiency anemia, unspecified: Secondary | ICD-10-CM | POA: Diagnosis not present

## 2019-06-30 DIAGNOSIS — M810 Age-related osteoporosis without current pathological fracture: Secondary | ICD-10-CM | POA: Diagnosis not present

## 2019-07-01 DIAGNOSIS — K922 Gastrointestinal hemorrhage, unspecified: Secondary | ICD-10-CM | POA: Diagnosis not present

## 2019-07-01 DIAGNOSIS — D500 Iron deficiency anemia secondary to blood loss (chronic): Secondary | ICD-10-CM | POA: Diagnosis not present

## 2019-07-01 DIAGNOSIS — D631 Anemia in chronic kidney disease: Secondary | ICD-10-CM | POA: Diagnosis not present

## 2019-07-01 DIAGNOSIS — N189 Chronic kidney disease, unspecified: Secondary | ICD-10-CM | POA: Diagnosis not present

## 2019-07-17 DIAGNOSIS — Z6822 Body mass index (BMI) 22.0-22.9, adult: Secondary | ICD-10-CM | POA: Diagnosis not present

## 2019-07-17 DIAGNOSIS — I5030 Unspecified diastolic (congestive) heart failure: Secondary | ICD-10-CM | POA: Diagnosis not present

## 2019-07-17 DIAGNOSIS — D509 Iron deficiency anemia, unspecified: Secondary | ICD-10-CM | POA: Diagnosis not present

## 2019-07-18 DIAGNOSIS — I5032 Chronic diastolic (congestive) heart failure: Secondary | ICD-10-CM | POA: Diagnosis not present

## 2019-07-18 DIAGNOSIS — I5030 Unspecified diastolic (congestive) heart failure: Secondary | ICD-10-CM | POA: Diagnosis not present

## 2019-07-18 DIAGNOSIS — N184 Chronic kidney disease, stage 4 (severe): Secondary | ICD-10-CM | POA: Diagnosis not present

## 2019-07-18 DIAGNOSIS — E1159 Type 2 diabetes mellitus with other circulatory complications: Secondary | ICD-10-CM | POA: Diagnosis not present

## 2019-07-25 DIAGNOSIS — E785 Hyperlipidemia, unspecified: Secondary | ICD-10-CM | POA: Diagnosis not present

## 2019-07-25 DIAGNOSIS — E1159 Type 2 diabetes mellitus with other circulatory complications: Secondary | ICD-10-CM | POA: Diagnosis not present

## 2019-07-25 DIAGNOSIS — I1310 Hypertensive heart and chronic kidney disease without heart failure, with stage 1 through stage 4 chronic kidney disease, or unspecified chronic kidney disease: Secondary | ICD-10-CM | POA: Diagnosis not present

## 2019-07-30 DIAGNOSIS — E119 Type 2 diabetes mellitus without complications: Secondary | ICD-10-CM | POA: Diagnosis not present

## 2019-07-30 DIAGNOSIS — N189 Chronic kidney disease, unspecified: Secondary | ICD-10-CM | POA: Diagnosis not present

## 2019-07-30 DIAGNOSIS — D638 Anemia in other chronic diseases classified elsewhere: Secondary | ICD-10-CM | POA: Diagnosis not present

## 2019-07-30 DIAGNOSIS — D631 Anemia in chronic kidney disease: Secondary | ICD-10-CM | POA: Diagnosis not present

## 2019-07-30 DIAGNOSIS — E611 Iron deficiency: Secondary | ICD-10-CM | POA: Diagnosis not present

## 2019-07-30 DIAGNOSIS — K922 Gastrointestinal hemorrhage, unspecified: Secondary | ICD-10-CM | POA: Diagnosis not present

## 2019-08-01 DIAGNOSIS — Z794 Long term (current) use of insulin: Secondary | ICD-10-CM | POA: Diagnosis not present

## 2019-08-01 DIAGNOSIS — D509 Iron deficiency anemia, unspecified: Secondary | ICD-10-CM | POA: Diagnosis not present

## 2019-08-01 DIAGNOSIS — E785 Hyperlipidemia, unspecified: Secondary | ICD-10-CM | POA: Diagnosis not present

## 2019-08-01 DIAGNOSIS — I5030 Unspecified diastolic (congestive) heart failure: Secondary | ICD-10-CM | POA: Diagnosis not present

## 2019-08-01 DIAGNOSIS — E1159 Type 2 diabetes mellitus with other circulatory complications: Secondary | ICD-10-CM | POA: Diagnosis not present

## 2019-08-05 DIAGNOSIS — N184 Chronic kidney disease, stage 4 (severe): Secondary | ICD-10-CM | POA: Diagnosis not present

## 2019-08-05 DIAGNOSIS — D509 Iron deficiency anemia, unspecified: Secondary | ICD-10-CM | POA: Diagnosis not present

## 2019-08-05 DIAGNOSIS — Z6822 Body mass index (BMI) 22.0-22.9, adult: Secondary | ICD-10-CM | POA: Diagnosis not present

## 2019-08-05 DIAGNOSIS — I5030 Unspecified diastolic (congestive) heart failure: Secondary | ICD-10-CM | POA: Diagnosis not present

## 2019-08-18 DIAGNOSIS — E785 Hyperlipidemia, unspecified: Secondary | ICD-10-CM | POA: Diagnosis not present

## 2019-08-18 DIAGNOSIS — N184 Chronic kidney disease, stage 4 (severe): Secondary | ICD-10-CM | POA: Diagnosis not present

## 2019-08-18 DIAGNOSIS — D509 Iron deficiency anemia, unspecified: Secondary | ICD-10-CM | POA: Diagnosis not present

## 2019-08-18 DIAGNOSIS — E1159 Type 2 diabetes mellitus with other circulatory complications: Secondary | ICD-10-CM | POA: Diagnosis not present

## 2019-08-18 DIAGNOSIS — I5030 Unspecified diastolic (congestive) heart failure: Secondary | ICD-10-CM | POA: Diagnosis not present

## 2019-08-18 DIAGNOSIS — Z6822 Body mass index (BMI) 22.0-22.9, adult: Secondary | ICD-10-CM | POA: Diagnosis not present

## 2019-08-29 DIAGNOSIS — Z6822 Body mass index (BMI) 22.0-22.9, adult: Secondary | ICD-10-CM | POA: Diagnosis not present

## 2019-08-29 DIAGNOSIS — N179 Acute kidney failure, unspecified: Secondary | ICD-10-CM | POA: Diagnosis not present

## 2019-08-29 DIAGNOSIS — N183 Chronic kidney disease, stage 3 unspecified: Secondary | ICD-10-CM | POA: Diagnosis not present

## 2019-08-29 DIAGNOSIS — E785 Hyperlipidemia, unspecified: Secondary | ICD-10-CM | POA: Diagnosis not present

## 2019-08-29 DIAGNOSIS — I5032 Chronic diastolic (congestive) heart failure: Secondary | ICD-10-CM | POA: Diagnosis not present

## 2019-08-29 DIAGNOSIS — Z87891 Personal history of nicotine dependence: Secondary | ICD-10-CM | POA: Diagnosis not present

## 2019-08-29 DIAGNOSIS — Z951 Presence of aortocoronary bypass graft: Secondary | ICD-10-CM | POA: Diagnosis not present

## 2019-08-29 DIAGNOSIS — I509 Heart failure, unspecified: Secondary | ICD-10-CM | POA: Diagnosis not present

## 2019-08-29 DIAGNOSIS — I2510 Atherosclerotic heart disease of native coronary artery without angina pectoris: Secondary | ICD-10-CM | POA: Diagnosis not present

## 2019-08-29 DIAGNOSIS — D500 Iron deficiency anemia secondary to blood loss (chronic): Secondary | ICD-10-CM | POA: Diagnosis not present

## 2019-08-29 DIAGNOSIS — I130 Hypertensive heart and chronic kidney disease with heart failure and stage 1 through stage 4 chronic kidney disease, or unspecified chronic kidney disease: Secondary | ICD-10-CM | POA: Diagnosis not present

## 2019-08-29 DIAGNOSIS — D62 Acute posthemorrhagic anemia: Secondary | ICD-10-CM | POA: Diagnosis not present

## 2019-08-29 DIAGNOSIS — Z7984 Long term (current) use of oral hypoglycemic drugs: Secondary | ICD-10-CM | POA: Diagnosis not present

## 2019-08-29 DIAGNOSIS — M1990 Unspecified osteoarthritis, unspecified site: Secondary | ICD-10-CM | POA: Diagnosis not present

## 2019-08-29 DIAGNOSIS — D649 Anemia, unspecified: Secondary | ICD-10-CM | POA: Diagnosis not present

## 2019-08-29 DIAGNOSIS — I252 Old myocardial infarction: Secondary | ICD-10-CM | POA: Diagnosis not present

## 2019-08-29 DIAGNOSIS — Z79899 Other long term (current) drug therapy: Secondary | ICD-10-CM | POA: Diagnosis not present

## 2019-08-29 DIAGNOSIS — E1122 Type 2 diabetes mellitus with diabetic chronic kidney disease: Secondary | ICD-10-CM | POA: Diagnosis not present

## 2019-08-30 DIAGNOSIS — D649 Anemia, unspecified: Secondary | ICD-10-CM | POA: Diagnosis not present

## 2019-09-01 DIAGNOSIS — D649 Anemia, unspecified: Secondary | ICD-10-CM | POA: Diagnosis not present

## 2019-09-08 DIAGNOSIS — E119 Type 2 diabetes mellitus without complications: Secondary | ICD-10-CM | POA: Diagnosis not present

## 2019-09-08 DIAGNOSIS — D638 Anemia in other chronic diseases classified elsewhere: Secondary | ICD-10-CM | POA: Diagnosis not present

## 2019-09-13 ENCOUNTER — Other Ambulatory Visit: Payer: Self-pay | Admitting: Cardiology

## 2019-09-15 DIAGNOSIS — E119 Type 2 diabetes mellitus without complications: Secondary | ICD-10-CM | POA: Diagnosis not present

## 2019-09-15 DIAGNOSIS — D638 Anemia in other chronic diseases classified elsewhere: Secondary | ICD-10-CM | POA: Diagnosis not present

## 2019-09-17 DIAGNOSIS — D500 Iron deficiency anemia secondary to blood loss (chronic): Secondary | ICD-10-CM | POA: Diagnosis not present

## 2019-09-17 DIAGNOSIS — E785 Hyperlipidemia, unspecified: Secondary | ICD-10-CM | POA: Diagnosis not present

## 2019-09-17 DIAGNOSIS — N184 Chronic kidney disease, stage 4 (severe): Secondary | ICD-10-CM | POA: Diagnosis not present

## 2019-09-17 DIAGNOSIS — I5032 Chronic diastolic (congestive) heart failure: Secondary | ICD-10-CM | POA: Diagnosis not present

## 2019-09-18 DIAGNOSIS — D509 Iron deficiency anemia, unspecified: Secondary | ICD-10-CM | POA: Diagnosis not present

## 2019-09-22 DIAGNOSIS — E119 Type 2 diabetes mellitus without complications: Secondary | ICD-10-CM | POA: Diagnosis not present

## 2019-09-22 DIAGNOSIS — D638 Anemia in other chronic diseases classified elsewhere: Secondary | ICD-10-CM | POA: Diagnosis not present

## 2019-09-23 DIAGNOSIS — R82998 Other abnormal findings in urine: Secondary | ICD-10-CM | POA: Diagnosis not present

## 2019-09-23 DIAGNOSIS — D638 Anemia in other chronic diseases classified elsewhere: Secondary | ICD-10-CM | POA: Diagnosis not present

## 2019-09-23 DIAGNOSIS — N184 Chronic kidney disease, stage 4 (severe): Secondary | ICD-10-CM | POA: Diagnosis not present

## 2019-09-23 DIAGNOSIS — E1122 Type 2 diabetes mellitus with diabetic chronic kidney disease: Secondary | ICD-10-CM | POA: Diagnosis not present

## 2019-09-23 DIAGNOSIS — R5383 Other fatigue: Secondary | ICD-10-CM | POA: Diagnosis not present

## 2019-09-23 DIAGNOSIS — I5032 Chronic diastolic (congestive) heart failure: Secondary | ICD-10-CM | POA: Diagnosis not present

## 2019-09-24 DIAGNOSIS — E860 Dehydration: Secondary | ICD-10-CM | POA: Diagnosis not present

## 2019-09-24 DIAGNOSIS — E871 Hypo-osmolality and hyponatremia: Secondary | ICD-10-CM | POA: Diagnosis not present

## 2019-09-24 DIAGNOSIS — N189 Chronic kidney disease, unspecified: Secondary | ICD-10-CM | POA: Diagnosis not present

## 2019-09-24 DIAGNOSIS — R0902 Hypoxemia: Secondary | ICD-10-CM | POA: Diagnosis not present

## 2019-09-24 DIAGNOSIS — R0602 Shortness of breath: Secondary | ICD-10-CM | POA: Diagnosis not present

## 2019-09-24 DIAGNOSIS — J1282 Pneumonia due to coronavirus disease 2019: Secondary | ICD-10-CM | POA: Diagnosis not present

## 2019-09-24 DIAGNOSIS — R05 Cough: Secondary | ICD-10-CM | POA: Diagnosis not present

## 2019-09-24 DIAGNOSIS — I4891 Unspecified atrial fibrillation: Secondary | ICD-10-CM | POA: Diagnosis not present

## 2019-09-24 DIAGNOSIS — I129 Hypertensive chronic kidney disease with stage 1 through stage 4 chronic kidney disease, or unspecified chronic kidney disease: Secondary | ICD-10-CM | POA: Diagnosis not present

## 2019-09-24 DIAGNOSIS — U071 COVID-19: Secondary | ICD-10-CM | POA: Diagnosis not present

## 2019-09-27 DIAGNOSIS — I129 Hypertensive chronic kidney disease with stage 1 through stage 4 chronic kidney disease, or unspecified chronic kidney disease: Secondary | ICD-10-CM | POA: Diagnosis not present

## 2019-09-27 DIAGNOSIS — J1282 Pneumonia due to coronavirus disease 2019: Secondary | ICD-10-CM | POA: Diagnosis not present

## 2019-09-27 DIAGNOSIS — I252 Old myocardial infarction: Secondary | ICD-10-CM | POA: Diagnosis not present

## 2019-09-27 DIAGNOSIS — Z79899 Other long term (current) drug therapy: Secondary | ICD-10-CM | POA: Diagnosis not present

## 2019-09-27 DIAGNOSIS — E871 Hypo-osmolality and hyponatremia: Secondary | ICD-10-CM | POA: Diagnosis not present

## 2019-09-27 DIAGNOSIS — Z87891 Personal history of nicotine dependence: Secondary | ICD-10-CM | POA: Diagnosis not present

## 2019-09-27 DIAGNOSIS — Z794 Long term (current) use of insulin: Secondary | ICD-10-CM | POA: Diagnosis not present

## 2019-09-27 DIAGNOSIS — I10 Essential (primary) hypertension: Secondary | ICD-10-CM | POA: Diagnosis not present

## 2019-09-27 DIAGNOSIS — E1165 Type 2 diabetes mellitus with hyperglycemia: Secondary | ICD-10-CM | POA: Diagnosis not present

## 2019-09-27 DIAGNOSIS — N189 Chronic kidney disease, unspecified: Secondary | ICD-10-CM | POA: Diagnosis not present

## 2019-09-27 DIAGNOSIS — R52 Pain, unspecified: Secondary | ICD-10-CM | POA: Diagnosis not present

## 2019-09-27 DIAGNOSIS — R0902 Hypoxemia: Secondary | ICD-10-CM | POA: Diagnosis not present

## 2019-09-27 DIAGNOSIS — R0600 Dyspnea, unspecified: Secondary | ICD-10-CM | POA: Diagnosis not present

## 2019-09-27 DIAGNOSIS — R0602 Shortness of breath: Secondary | ICD-10-CM | POA: Diagnosis not present

## 2019-09-27 DIAGNOSIS — Z951 Presence of aortocoronary bypass graft: Secondary | ICD-10-CM | POA: Diagnosis not present

## 2019-09-27 DIAGNOSIS — U071 COVID-19: Secondary | ICD-10-CM | POA: Diagnosis not present

## 2019-09-27 DIAGNOSIS — I4891 Unspecified atrial fibrillation: Secondary | ICD-10-CM | POA: Diagnosis not present

## 2019-09-27 DIAGNOSIS — D649 Anemia, unspecified: Secondary | ICD-10-CM | POA: Diagnosis not present

## 2019-09-27 DIAGNOSIS — I2510 Atherosclerotic heart disease of native coronary artery without angina pectoris: Secondary | ICD-10-CM | POA: Diagnosis not present

## 2019-09-27 DIAGNOSIS — I509 Heart failure, unspecified: Secondary | ICD-10-CM | POA: Diagnosis not present

## 2019-09-27 DIAGNOSIS — R918 Other nonspecific abnormal finding of lung field: Secondary | ICD-10-CM | POA: Diagnosis not present

## 2019-09-27 DIAGNOSIS — K219 Gastro-esophageal reflux disease without esophagitis: Secondary | ICD-10-CM | POA: Diagnosis not present

## 2019-09-27 DIAGNOSIS — E875 Hyperkalemia: Secondary | ICD-10-CM | POA: Diagnosis not present

## 2019-09-28 ENCOUNTER — Other Ambulatory Visit: Payer: Self-pay

## 2019-09-28 ENCOUNTER — Inpatient Hospital Stay (HOSPITAL_COMMUNITY): Payer: PPO

## 2019-09-28 ENCOUNTER — Inpatient Hospital Stay (HOSPITAL_COMMUNITY)
Admission: AD | Admit: 2019-09-28 | Discharge: 2019-10-01 | DRG: 177 | Disposition: A | Discharge status: Hospice | Payer: PPO | Source: Other Acute Inpatient Hospital | Attending: Internal Medicine | Admitting: Internal Medicine

## 2019-09-28 DIAGNOSIS — Z823 Family history of stroke: Secondary | ICD-10-CM | POA: Diagnosis not present

## 2019-09-28 DIAGNOSIS — Z79899 Other long term (current) drug therapy: Secondary | ICD-10-CM | POA: Diagnosis not present

## 2019-09-28 DIAGNOSIS — Z7982 Long term (current) use of aspirin: Secondary | ICD-10-CM | POA: Diagnosis not present

## 2019-09-28 DIAGNOSIS — Z743 Need for continuous supervision: Secondary | ICD-10-CM | POA: Diagnosis not present

## 2019-09-28 DIAGNOSIS — I2510 Atherosclerotic heart disease of native coronary artery without angina pectoris: Secondary | ICD-10-CM | POA: Diagnosis present

## 2019-09-28 DIAGNOSIS — Z66 Do not resuscitate: Secondary | ICD-10-CM | POA: Diagnosis not present

## 2019-09-28 DIAGNOSIS — E1122 Type 2 diabetes mellitus with diabetic chronic kidney disease: Secondary | ICD-10-CM | POA: Diagnosis not present

## 2019-09-28 DIAGNOSIS — Z87891 Personal history of nicotine dependence: Secondary | ICD-10-CM

## 2019-09-28 DIAGNOSIS — R52 Pain, unspecified: Secondary | ICD-10-CM

## 2019-09-28 DIAGNOSIS — U071 COVID-19: Secondary | ICD-10-CM | POA: Diagnosis not present

## 2019-09-28 DIAGNOSIS — J1282 Pneumonia due to coronavirus disease 2019: Secondary | ICD-10-CM | POA: Diagnosis present

## 2019-09-28 DIAGNOSIS — J1289 Other viral pneumonia: Secondary | ICD-10-CM | POA: Diagnosis not present

## 2019-09-28 DIAGNOSIS — Z515 Encounter for palliative care: Secondary | ICD-10-CM | POA: Diagnosis present

## 2019-09-28 DIAGNOSIS — M1611 Unilateral primary osteoarthritis, right hip: Secondary | ICD-10-CM | POA: Diagnosis not present

## 2019-09-28 DIAGNOSIS — M25551 Pain in right hip: Secondary | ICD-10-CM | POA: Diagnosis present

## 2019-09-28 DIAGNOSIS — N1831 Chronic kidney disease, stage 3a: Secondary | ICD-10-CM

## 2019-09-28 DIAGNOSIS — M791 Myalgia, unspecified site: Secondary | ICD-10-CM | POA: Diagnosis not present

## 2019-09-28 DIAGNOSIS — N179 Acute kidney failure, unspecified: Secondary | ICD-10-CM | POA: Diagnosis present

## 2019-09-28 DIAGNOSIS — J189 Pneumonia, unspecified organism: Secondary | ICD-10-CM

## 2019-09-28 DIAGNOSIS — Z888 Allergy status to other drugs, medicaments and biological substances status: Secondary | ICD-10-CM

## 2019-09-28 DIAGNOSIS — R279 Unspecified lack of coordination: Secondary | ICD-10-CM | POA: Diagnosis not present

## 2019-09-28 DIAGNOSIS — E785 Hyperlipidemia, unspecified: Secondary | ICD-10-CM | POA: Diagnosis present

## 2019-09-28 DIAGNOSIS — D509 Iron deficiency anemia, unspecified: Secondary | ICD-10-CM | POA: Diagnosis not present

## 2019-09-28 DIAGNOSIS — Z794 Long term (current) use of insulin: Secondary | ICD-10-CM | POA: Diagnosis not present

## 2019-09-28 DIAGNOSIS — Z7401 Bed confinement status: Secondary | ICD-10-CM | POA: Diagnosis not present

## 2019-09-28 DIAGNOSIS — G9349 Other encephalopathy: Secondary | ICD-10-CM | POA: Diagnosis not present

## 2019-09-28 DIAGNOSIS — N189 Chronic kidney disease, unspecified: Secondary | ICD-10-CM | POA: Diagnosis present

## 2019-09-28 DIAGNOSIS — I480 Paroxysmal atrial fibrillation: Secondary | ICD-10-CM | POA: Diagnosis not present

## 2019-09-28 DIAGNOSIS — I5032 Chronic diastolic (congestive) heart failure: Secondary | ICD-10-CM | POA: Diagnosis not present

## 2019-09-28 DIAGNOSIS — R4182 Altered mental status, unspecified: Secondary | ICD-10-CM | POA: Diagnosis not present

## 2019-09-28 DIAGNOSIS — I130 Hypertensive heart and chronic kidney disease with heart failure and stage 1 through stage 4 chronic kidney disease, or unspecified chronic kidney disease: Secondary | ICD-10-CM | POA: Diagnosis not present

## 2019-09-28 DIAGNOSIS — Z8249 Family history of ischemic heart disease and other diseases of the circulatory system: Secondary | ICD-10-CM | POA: Diagnosis not present

## 2019-09-28 DIAGNOSIS — I10 Essential (primary) hypertension: Secondary | ICD-10-CM | POA: Diagnosis not present

## 2019-09-28 DIAGNOSIS — J9601 Acute respiratory failure with hypoxia: Secondary | ICD-10-CM

## 2019-09-28 DIAGNOSIS — E875 Hyperkalemia: Secondary | ICD-10-CM | POA: Diagnosis not present

## 2019-09-28 DIAGNOSIS — N183 Chronic kidney disease, stage 3 unspecified: Secondary | ICD-10-CM | POA: Diagnosis present

## 2019-09-28 DIAGNOSIS — E11649 Type 2 diabetes mellitus with hypoglycemia without coma: Secondary | ICD-10-CM | POA: Diagnosis not present

## 2019-09-28 DIAGNOSIS — R0902 Hypoxemia: Secondary | ICD-10-CM | POA: Diagnosis not present

## 2019-09-28 DIAGNOSIS — Z885 Allergy status to narcotic agent status: Secondary | ICD-10-CM | POA: Diagnosis not present

## 2019-09-28 DIAGNOSIS — I110 Hypertensive heart disease with heart failure: Secondary | ICD-10-CM

## 2019-09-28 DIAGNOSIS — M2550 Pain in unspecified joint: Secondary | ICD-10-CM | POA: Diagnosis not present

## 2019-09-28 LAB — D-DIMER, QUANTITATIVE: D-Dimer, Quant: 1.03 ug/mL-FEU — ABNORMAL HIGH (ref 0.00–0.50)

## 2019-09-28 LAB — COMPREHENSIVE METABOLIC PANEL
ALT: 12 U/L (ref 0–44)
AST: 22 U/L (ref 15–41)
Albumin: 2.5 g/dL — ABNORMAL LOW (ref 3.5–5.0)
Alkaline Phosphatase: 60 U/L (ref 38–126)
Anion gap: 11 (ref 5–15)
BUN: 34 mg/dL — ABNORMAL HIGH (ref 8–23)
CO2: 17 mmol/L — ABNORMAL LOW (ref 22–32)
Calcium: 8.2 mg/dL — ABNORMAL LOW (ref 8.9–10.3)
Chloride: 103 mmol/L (ref 98–111)
Creatinine, Ser: 1.11 mg/dL — ABNORMAL HIGH (ref 0.44–1.00)
GFR calc Af Amer: 51 mL/min — ABNORMAL LOW (ref >60)
GFR calc non Af Amer: 44 mL/min — ABNORMAL LOW (ref >60)
Glucose, Bld: 293 mg/dL — ABNORMAL HIGH (ref 70–99)
Potassium: 4.8 mmol/L (ref 3.5–5.1)
Sodium: 131 mmol/L — ABNORMAL LOW (ref 135–145)
Total Bilirubin: 0.4 mg/dL (ref 0.3–1.2)
Total Protein: 6.7 g/dL (ref 6.5–8.1)

## 2019-09-28 LAB — CBC WITH DIFFERENTIAL/PLATELET
Abs Immature Granulocytes: 0.11 10*3/uL — ABNORMAL HIGH (ref 0.00–0.07)
Basophils Absolute: 0 10*3/uL (ref 0.0–0.1)
Basophils Relative: 0 %
Eosinophils Absolute: 0 10*3/uL (ref 0.0–0.5)
Eosinophils Relative: 0 %
HCT: 28.8 % — ABNORMAL LOW (ref 36.0–46.0)
Hemoglobin: 8.8 g/dL — ABNORMAL LOW (ref 12.0–15.0)
Immature Granulocytes: 2 %
Lymphocytes Relative: 8 %
Lymphs Abs: 0.4 10*3/uL — ABNORMAL LOW (ref 0.7–4.0)
MCH: 28.5 pg (ref 26.0–34.0)
MCHC: 30.6 g/dL (ref 30.0–36.0)
MCV: 93.2 fL (ref 80.0–100.0)
Monocytes Absolute: 0.1 10*3/uL (ref 0.1–1.0)
Monocytes Relative: 3 %
Neutro Abs: 4 10*3/uL (ref 1.7–7.7)
Neutrophils Relative %: 87 %
Platelets: 315 10*3/uL (ref 150–400)
RBC: 3.09 MIL/uL — ABNORMAL LOW (ref 3.87–5.11)
RDW: 16.4 % — ABNORMAL HIGH (ref 11.5–15.5)
WBC: 4.6 10*3/uL (ref 4.0–10.5)
nRBC: 0.6 % — ABNORMAL HIGH (ref 0.0–0.2)

## 2019-09-28 LAB — FERRITIN: Ferritin: 1180 ng/mL — ABNORMAL HIGH (ref 11–307)

## 2019-09-28 LAB — GLUCOSE, CAPILLARY
Glucose-Capillary: 240 mg/dL — ABNORMAL HIGH (ref 70–99)
Glucose-Capillary: 99 mg/dL (ref 70–99)
Glucose-Capillary: 147 mg/dL — ABNORMAL HIGH (ref 70–99)

## 2019-09-28 LAB — C-REACTIVE PROTEIN: CRP: 15 mg/dL — ABNORMAL HIGH (ref <1.0)

## 2019-09-28 LAB — HEMOGLOBIN A1C
Hgb A1c MFr Bld: 6 % — ABNORMAL HIGH (ref 4.8–5.6)
Mean Plasma Glucose: 125.5 mg/dL

## 2019-09-28 MED ORDER — INSULIN ASPART 100 UNIT/ML ~~LOC~~ SOLN
0.0000 [IU] | Freq: Every day | SUBCUTANEOUS | Status: DC
  Administered 2019-09-29: 4 [IU] via SUBCUTANEOUS

## 2019-09-28 MED ORDER — TORSEMIDE 20 MG PO TABS: 20.0000 mg | ORAL_TABLET | Freq: Two times a day (BID) | ORAL | Status: DC

## 2019-09-28 MED ORDER — ACETAMINOPHEN 325 MG PO TABS
650.0000 mg | ORAL_TABLET | Freq: Four times a day (QID) | ORAL | Status: DC | PRN
  Administered 2019-09-28: 06:00:00 650 mg via ORAL
  Filled 2019-09-28: qty 2

## 2019-09-28 MED ORDER — ZINC SULFATE 220 (50 ZN) MG PO CAPS
220.0000 mg | ORAL_CAPSULE | Freq: Every day | ORAL | Status: DC
  Administered 2019-09-28 – 2019-09-29 (×2): 220 mg via ORAL
  Filled 2019-09-28 (×2): qty 1

## 2019-09-28 MED ORDER — CYCLOBENZAPRINE HCL 10 MG PO TABS
5.0000 mg | ORAL_TABLET | Freq: Three times a day (TID) | ORAL | Status: DC | PRN
  Administered 2019-09-28: 19:00:00 5 mg via ORAL
  Filled 2019-09-28 (×2): qty 1

## 2019-09-28 MED ORDER — TORSEMIDE 20 MG PO TABS
20.0000 mg | ORAL_TABLET | Freq: Two times a day (BID) | ORAL | Status: DC
  Administered 2019-09-28 – 2019-09-29 (×4): 20 mg via ORAL
  Filled 2019-09-28 (×5): qty 1

## 2019-09-28 MED ORDER — SODIUM CHLORIDE 0.9 % IV SOLN
1.0000 g | Freq: Two times a day (BID) | INTRAVENOUS | Status: DC
  Administered 2019-09-28: 22:00:00 1 g via INTRAVENOUS
  Filled 2019-09-28: qty 1

## 2019-09-28 MED ORDER — GUAIFENESIN-DM 100-10 MG/5ML PO SYRP: 10.0000 mL | ORAL_SOLUTION | ORAL | Status: DC | PRN

## 2019-09-28 MED ORDER — INSULIN ASPART 100 UNIT/ML ~~LOC~~ SOLN
0.0000 [IU] | Freq: Three times a day (TID) | SUBCUTANEOUS | Status: DC
  Administered 2019-09-28: 12:00:00 7 [IU] via SUBCUTANEOUS
  Administered 2019-09-28 – 2019-09-29 (×3): 11 [IU] via SUBCUTANEOUS
  Administered 2019-09-30: 10:00:00 20 [IU] via SUBCUTANEOUS

## 2019-09-28 MED ORDER — DEXTROSE 5 % IV SOLN
500.0000 mg | Freq: Two times a day (BID) | INTRAVENOUS | Status: DC
  Administered 2019-09-28: 500 mg via INTRAVENOUS
  Filled 2019-09-28 (×2): qty 0.5

## 2019-09-28 MED ORDER — ENOXAPARIN SODIUM 40 MG/0.4ML ~~LOC~~ SOLN
40.0000 mg | SUBCUTANEOUS | Status: DC
  Administered 2019-09-28: 06:00:00 40 mg via SUBCUTANEOUS
  Filled 2019-09-28: qty 0.4

## 2019-09-28 MED ORDER — ASCORBIC ACID 500 MG PO TABS
500.0000 mg | ORAL_TABLET | Freq: Every day | ORAL | Status: DC
  Administered 2019-09-28 – 2019-09-29 (×2): 500 mg via ORAL
  Filled 2019-09-28 (×2): qty 1

## 2019-09-28 MED ORDER — SODIUM CHLORIDE 0.9 % IV SOLN
100.0000 mg | Freq: Every day | INTRAVENOUS | Status: AC
  Administered 2019-09-28 – 2019-10-01 (×4): 100 mg via INTRAVENOUS
  Filled 2019-09-28 (×4): qty 20

## 2019-09-28 MED ORDER — DEXAMETHASONE 6 MG PO TABS
6.0000 mg | ORAL_TABLET | ORAL | Status: DC
  Administered 2019-09-28 – 2019-09-29 (×2): 6 mg via ORAL
  Filled 2019-09-28 (×2): qty 1

## 2019-09-28 MED ORDER — DOCUSATE SODIUM 100 MG PO CAPS
100.0000 mg | ORAL_CAPSULE | Freq: Every day | ORAL | Status: DC
  Administered 2019-09-28 – 2019-09-29 (×2): 100 mg via ORAL
  Filled 2019-09-28 (×2): qty 1

## 2019-09-28 MED ORDER — HYDROMORPHONE HCL 1 MG/ML IJ SOLN
0.5000 mg | INTRAMUSCULAR | Status: DC | PRN
  Administered 2019-09-28 – 2019-09-30 (×3): 0.5 mg via INTRAVENOUS
  Filled 2019-09-28 (×3): qty 0.5

## 2019-09-28 MED ORDER — ISOSORBIDE MONONITRATE ER 30 MG PO TB24
30.0000 mg | ORAL_TABLET | Freq: Three times a day (TID) | ORAL | Status: DC
  Administered 2019-09-28 – 2019-09-29 (×6): 30 mg via ORAL
  Filled 2019-09-28 (×6): qty 1

## 2019-09-28 MED ORDER — LIDOCAINE 5 % EX PTCH
1.0000 | MEDICATED_PATCH | CUTANEOUS | Status: DC
  Administered 2019-09-28 – 2019-09-29 (×2): 1 via TRANSDERMAL
  Filled 2019-09-28 (×4): qty 1

## 2019-09-28 MED ORDER — IPRATROPIUM-ALBUTEROL 20-100 MCG/ACT IN AERS
1.0000 | INHALATION_SPRAY | Freq: Four times a day (QID) | RESPIRATORY_TRACT | Status: DC
  Administered 2019-09-28 – 2019-10-01 (×12): 1 via RESPIRATORY_TRACT
  Filled 2019-09-28: qty 4

## 2019-09-28 MED ORDER — AMLODIPINE BESYLATE 10 MG PO TABS
10.0000 mg | ORAL_TABLET | Freq: Every day | ORAL | Status: DC
  Administered 2019-09-28 – 2019-09-29 (×2): 10 mg via ORAL
  Filled 2019-09-28 (×2): qty 1

## 2019-09-28 MED ORDER — HYDROCOD POLST-CPM POLST ER 10-8 MG/5ML PO SUER: 5.0000 mL | Freq: Two times a day (BID) | ORAL | Status: DC | PRN

## 2019-09-28 MED ORDER — HYDROCODONE-ACETAMINOPHEN 5-325 MG PO TABS
1.0000 | ORAL_TABLET | ORAL | Status: DC | PRN
  Administered 2019-09-28 – 2019-09-29 (×4): 1 via ORAL
  Filled 2019-09-28 (×5): qty 1

## 2019-09-28 MED ORDER — HYDROMORPHONE HCL 1 MG/ML IJ SOLN
INTRAMUSCULAR | Status: AC
  Administered 2019-09-28: 11:00:00 0.5 mg via INTRAVENOUS
  Filled 2019-09-28: qty 1

## 2019-09-28 MED ORDER — INSULIN DETEMIR 100 UNIT/ML ~~LOC~~ SOLN
10.0000 [IU] | Freq: Every day | SUBCUTANEOUS | Status: DC
  Administered 2019-09-28 – 2019-09-29 (×2): 10 [IU] via SUBCUTANEOUS
  Filled 2019-09-28 (×4): qty 0.1

## 2019-09-28 MED ORDER — HYDROMORPHONE HCL 1 MG/ML IJ SOLN: 0.5000 mg | Freq: Once | INTRAMUSCULAR | Status: AC

## 2019-09-28 NOTE — Progress Notes (Signed)
Patient's granddaughter Freida Busman called and was updated on patient status and plan of care. Code status was confirmed with Candy while patient was present and verbalized her understanding. Confirmed with Candy that the patient's daughter Denice Paradise is her HCPOA. Candy to fax paperwork to [facility] in the morning. No further questions at this time.

## 2019-09-28 NOTE — Plan of Care (Signed)

## 2019-09-28 NOTE — Progress Notes (Signed)
Pt reporting 8/10 cramping pain in leg unrelieved by tylenol. Physician notified.

## 2019-09-28 NOTE — Progress Notes (Signed)
Pt transferred from [facility] ED - spoke briefly with the [facility] pharmacist  Pt received the following: Remdesivir 200mg  IV 1/9 1641 Cefepime 2gm IV 1/9 1623 Decadron 6mg  IV 1/9 1637 SQ heparin 5000 units 1/9 2219  SCr 1.2  To continue remdesivir 100mg  IV daily x 4 doses to complete 5 day course  Sherlon Handing, PharmD, BCPS Please see amion for complete clinical pharmacist phone list 09/28/2019 2:03 AM

## 2019-09-28 NOTE — Progress Notes (Signed)
Patient arrived on unit alert and oriented x4. Vital signs stable on 6L [place]. Call light in reach and bed alarm on. Visitor and belongings policies explained. Patient verbalizes understanding. Physician notified of patient's arrival.  Patient states she would like granddaughter Regina Herrera [phone]) to be point of contact. Daughter Regina Herrera called to verify Regina Herrera's phone number and updated on patient status and plan of care.

## 2019-09-28 NOTE — Plan of Care (Signed)
  Problem: Education: Goal: Knowledge of risk factors and measures for prevention of condition will improve Outcome: Progressing   Problem: Coping: Goal: Psychosocial and spiritual needs will be supported Outcome: Progressing   Problem: Respiratory: Goal: Will maintain a patent airway Outcome: Progressing   Problem: Education: Goal: Knowledge of General Education information will improve Description: Including pain rating scale, medication(s)/side effects and non-pharmacologic comfort measures Outcome: Progressing   Problem: Health Behavior/Discharge Planning: Goal: Ability to manage health-related needs will improve Outcome: Progressing   Problem: Clinical Measurements: Goal: Ability to maintain clinical measurements within normal limits will improve Outcome: Progressing Goal: Will remain free from infection Outcome: Progressing Goal: Diagnostic test results will improve Outcome: Progressing Goal: Respiratory complications will improve Outcome: Progressing Goal: Cardiovascular complication will be avoided Outcome: Progressing   Problem: Activity: Goal: Risk for activity intolerance will decrease Outcome: Progressing   Problem: Nutrition: Goal: Adequate nutrition will be maintained Outcome: Progressing   Problem: Coping: Goal: Level of anxiety will decrease Outcome: Progressing   Problem: Elimination: Goal: Will not experience complications related to bowel motility Outcome: Progressing Goal: Will not experience complications related to urinary retention Outcome: Progressing   Problem: Pain Managment: Goal: General experience of comfort will improve Outcome: Progressing   Problem: Safety: Goal: Ability to remain free from injury will improve Outcome: Progressing   Problem: Skin Integrity: Goal: Risk for impaired skin integrity will decrease Outcome: Progressing   

## 2019-09-28 NOTE — H&P (Signed)
TRH H&P   Patient Demographics:    Regina Herrera, is a 86 y.o. female  MRN: [id number]   DOB - Aug 29, 1927  Admit Date - 09/28/2019  Outpatient Primary MD for the patient is Marco Collie, MD  Patient coming from: [facility] health  No chief complaint on file.     HPI:    Regina Herrera  is a 86 y.o. female, with IDDM, HTN, HLD, HFpEF, who presents as a transfer from [facility] where she presented with worsening shortness of breath, generalized weakness, myalgias, found to be in A. fib with RVR as well as acute respiratory failure with hypoxia.  Patient reports she has not felt well for the last 6 months, she has had intermittent GIB, she has had multiple blood transfusions.  Over the past week she has been having worsening shortness of breath as well as generalized weakness, found to be anemic and received blood transfusion, she did not improve significantly therefore she was tested for SARS-CoV-2 and found to be positive on 09/24/2019.  She was started on albuterol HandiHaler, the symptoms progressively worsened prompting her family to call EMS.  Upon EMS arrival sats were in the 70s and she was transported on nonrebreather.  She admits to intermittent fevers with chills.  She has a cough which is nonproductive.  She denies any anosmia or ageusia.  She denies any orthopnea, PND, lower extremity edema.  No chest pain.  No hemoptysis.  She denies any hematemesis, hematochezia or melena.  OSH records:  WBC 6.9, Hgb 8.9, HCT 27.7, PLT 273 NA 136, K5.2, CL 98, CO2 16, BUN CR 1.4 glucose 253, CA 7.8, mg 1.4 TP 6.1, albumin 3.4, T bili 0.4, AST 35, ALT 11, ALP 88 CPK 109, troponin < 0.01, BNP 2570(<1800) Procalcitonin 0.34,  ferritin >1000 PT 11.0, INR 1.0, PTT 32.2, D-dimer 1579(<500) pH 7.30, PCO2 28, PO2 68 on 5 L/min [place] O2  CXR new bilateral pulmonary infiltrates consistent with patient's COVID-19 positive status   Review of systems:  Review of Systems:  Constitutional: see HPI HEENT: negative for earaches, epistaxis, or sore throat Respiratory: see HPI Cardiovascular: negative for chest pain, palpitations, or syncope GU: negative for dysuria, urinary frequency, urinary urgency, hematuria Gastrointestinal: negative for abdominal pain, constipation, diarrhea, nausea or vomiting Musculoskeletal: negative for arthralgias, back pain or myalgias Neurological: negative for  dizziness, headaches or weakness Behavioral/Psych: negative for suicidal or homicidal ideation Skin:negative for rash Heme: negative for bruises Endo: negative for hair loss, weight gain/loss  With Past History of the following :   Past Medical History:  Diagnosis Date  . Anemia 05/08/2017  . CKD (chronic kidney disease) 05/08/2017  . Coronary artery disease involving native coronary artery of native heart 06/09/2015   Overview:   . DM (diabetes mellitus) ([place]) 05/08/2017  . Essential hypertension 06/09/2015  . Hyperlipidemia 06/09/2015   Overview:  poor statin tolerance  . Iron deficiency anemia 06/10/2015  . LVH (left ventricular hypertrophy) 06/10/2015  . PAF (paroxysmal atrial fibrillation) ([place]) 05/08/2017  . Sinus bradycardia 05/08/2017  . Syncope 05/08/2017     Past Surgical History:  Procedure Laterality Date  . CARDIAC CATHETERIZATION    . CHOLECYSTECTOMY    . CORONARY ANGIOPLASTY    . CORONARY ARTERY BYPASS GRAFT    . PARTIAL HYSTERECTOMY  1974    Social History:    Social History   Tobacco Use  . Smoking status: Former Research scientist (life sciences)  . Smokeless tobacco: Never Used  Substance Use Topics  . Alcohol use: No    Family History :    Family History  Problem Relation Age of Onset  . Stroke Mother   . Heart attack Father     . Heart disease Sister   . Cancer Brother   . Heart disease Brother     Home Medications:   Prior to Admission medications   Medication Sig Start Date End Date Taking? Authorizing Provider  amLODipine (NORVASC) 10 MG tablet Take 10 mg by mouth daily.    [provider]  Ascorbic Acid (VITAMIN C) 100 MG tablet Take 100 mg by mouth daily.    [provider]  aspirin 81 MG chewable tablet Chew 81 mg by mouth daily.    [provider]  cetirizine (ZYRTEC) 10 MG tablet Take 10 mg by mouth daily.    [provider]  denosumab (PROLIA) 60 MG/ML SOLN injection Inject 1 Dose into the skin every 6 (six) months. 04/21/16   [provider]  ezetimibe (ZETIA) 10 MG tablet Take 10 mg by mouth daily.    [provider]  ferrous sulfate 325 (65 FE) MG tablet Take 325 mg by mouth daily with breakfast.    [provider]  gemfibrozil (LOPID) 600 MG tablet Take 600 mg by mouth 2 (two) times daily. 04/26/17   [provider]  glimepiride (AMARYL) 1 MG tablet Take 1 mg by mouth daily.  04/01/17   [provider]  Insulin Detemir (LEVEMIR FLEXTOUCH) 100 UNIT/ML Pen Inject 10 Units into the skin daily.    [provider]  isosorbide mononitrate (IMDUR) 30 MG 24 hr tablet Take 30 mg by mouth 3 (three) times daily.    [provider]  meclizine (ANTIVERT) 12.5 MG tablet Take 1 tablet by mouth every 8 (eight) hours as needed for dizziness. 04/19/16   [provider]  nitroGLYCERIN (NITROSTAT) 0.4 MG SL tablet Place 1 tablet (0.4 mg total) under the tongue every 5 (five) minutes as needed for chest pain. 10/24/18   Richardo Priest, MD  Omega-3 Fatty Acids (FISH OIL) 1000 MG CAPS Take 1 capsule by mouth daily.     [provider]  pantoprazole (PROTONIX) 40 MG tablet Take 40 mg by mouth daily. 04/30/17   [provider]  pravastatin (PRAVACHOL) 10 MG tablet TAKE 1/2 TABLET(5 MG) BY MOUTH 2  TIMES A WEEK(  THURSDAY AND SATURDAY 09/15/19   Richardo Priest, MD  torsemide (DEMADEX) 20 MG tablet Take 1 tablet twice daily. Take 1 extra tablet every other day. 05/14/19   Richardo Priest, MD    Allergies:    Allergies  Allergen Reactions  . Ciprofloxacin     GI upset  . Lipitor [Atorvastatin Calcium]     Muscle cramps  . Codeine Nausea And Vomiting    Physical Exam:   Vitals  Blood pressure (!) 157/81, pulse 99, temperature 97.7 F (36.5 C), temperature source Oral, resp. rate 20, height 5\' 2"  (1.575 m), weight 58.8 kg, SpO2 91 %.  Physical Exam   Constitutional - resting comfortably, no acute distress Eyes - pupils equal round and reactive to light and accomodation, extra ocular movements intact Nose - no gross deformity or drainage Mouth - no oral lesions noted Throat - no swelling or erythema Neck - supple, no JVD   CV - (+)S1S2, no murmurs  Resp - crackles in lung bases,  GI - (+)BS, soft, non-tender, non-distended Extrem - no clubbing, cyanosis, or peripheral edema  Skin - no rashes or wounds Neuro - alert, aware, oriented to person/place/time  Psych - normal affect, no anxiety   Patient has Pressure Ulcer on Admission?: no   Data Review:    CBC No results for input(s): WBC, HGB, HCT, PLT, MCV, MCH, MCHC, RDW, LYMPHSABS, MONOABS, EOSABS, BASOSABS, BANDABS in the last 168 hours.  Invalid input(s): NEUTRABS, BANDSABD ------------------------------------------------------------------------------------------------------------------  Chemistries  No results for input(s): NA, K, CL, CO2, GLUCOSE, BUN, CREATININE, CALCIUM, MG, AST, ALT, ALKPHOS, BILITOT in the last 168 hours.  Invalid input(s): GFRCGP ------------------------------------------------------------------------------------------------------------------ CrCl cannot be calculated (Patient's most recent lab result is older than the maximum 21 days  allowed.). ------------------------------------------------------------------------------------------------------------------ No results for input(s): TSH, T4TOTAL, T3FREE, THYROIDAB in the last 72 hours.  Invalid input(s): FREET3  Coagulation profile No results for input(s): INR, PROTIME in the last 168 hours. ------------------------------------------------------------------------------------------------------------------- No results for input(s): DDIMER in the last 72 hours. -------------------------------------------------------------------------------------------------------------------  Cardiac Enzymes No results for input(s): CKMB, TROPONINI, MYOGLOBIN in the last 168 hours.  Invalid input(s): CK ------------------------------------------------------------------------------------------------------------------ No results found for: BNP   ---------------------------------------------------------------------------------------------------------------  Urinalysis No results found for: COLORURINE, APPEARANCEUR, LABSPEC, [place], GLUCOSEU, HGBUR, BILIRUBINUR, KETONESUR, PROTEINUR, UROBILINOGEN, NITRITE, LEUKOCYTESUR  ----------------------------------------------------------------------------------------------------------------   Imaging Results:    No results found.   Assessment & Plan:    Principal Problem:   Acute hypoxemic respiratory failure due to severe acute respiratory syndrome coronavirus 2 (SARS-CoV-2) disease (HCC) Active Problems:   Chronic diastolic heart failure (HCC)   Coronary artery disease involving native coronary artery of native heart   Hypertensive heart disease with heart failure (HCC)   Hyperlipidemia   CKD (chronic kidney disease)   PAF (paroxysmal atrial fibrillation) ([place])     Acute hypoxemic respiratory failure due to SARS-CoV-2 disease: Patient presented with worsening shortness of breath after testing positive for SARS-CoV-2 on 09/24/2019,  found to be in respiratory failure with hypoxia. Date of Dx: 09/24/19 Oxygen requirements: 6 LPM Antibiotics: Mildly elevated procalcitonin.  Was started on cefepime for HCAP given recent hospitalizations, which will be continued as inpatient. Diuretics: Chronically on torsemide which will be continued as inpatient. Vitamin C and Zinc: Per protocol Remdesivir: Started on 1/9 Steroids: Started on 1/9 Actemra: Not given yet Convalescent Plasma: Not given yet    Chronic diastolic heart failure: Clinically euvolemic.  Continue torsemide    CAD involving native coronary artery of native  heart: She denies any chest pain.  Continue Imdur.  Resume statin at discharge.    Hypertensive heart disease with heart failure [facility]): Blood pressure is close to goal.  Continue amlodipine    Hyperlipidemia: Cardiac diet.    CKD: Renal function is close to baseline.  Avoid nephrotoxic agents.  Continue to monitor.   DVT Prophylaxis Lovenox  AM Labs Ordered, also please review Full Orders  Family Communication: Admission, patients condition and plan of care including tests being ordered have been discussed with the patient who indicate understanding and agree with the plan and Code Status.  Code Status DNR  Likely DC to  Home  Condition GUARDED    Consults called: None    Admission status: Admit to inpatient   Time spent in minutes: 8   Peyton Bottoms M.D on 09/28/2019 at 1:57 AM  To page go to [email or web address] - password [facility]

## 2019-09-28 NOTE — Progress Notes (Signed)
PROGRESS NOTE    Regina Herrera  [id number] DOB: 1926-10-09 DOA: 09/28/2019 PCP: Marco Collie, MD    Brief Narrative:  From the admission notes: Regina Herrera  is a 86 y.o. female, with IDDM, paroxysmal A. fib, HTN, HLD, HFpEF, who presents as a transfer from [facility] where she presented with worsening shortness of breath, generalized weakness, myalgias, found to be in A. fib with RVR as well as acute respiratory failure with hypoxia.  Patient reports she has not felt well for the last 6 months, she has had intermittent GIB, she has had multiple blood transfusions.  Over the past week she has been having worsening shortness of breath as well as generalized weakness, found to be anemic and received blood transfusion, she did not improve significantly therefore she was tested for SARS-CoV-2 and found to be positive on 09/24/2019. She was started on albuterol HandiHaler, the symptoms progressively worsened prompting her family to call EMS.   Upon EMS arrival sats were in the 70s and she was transported on nonrebreather.  She admits to intermittent fevers with chills.  She has a cough which is nonproductive.  She denies any orthopnea, PND, lower extremity edema.  No chest pain.  No hemoptysis.  She denies any hematemesis, hematochezia or melena. Patient was transferred to [place] on Cardizem drip and supplemental oxygen.  Records from [facility]: WBC 6.9, Hgb 8.9, HCT 27.7, PLT 273 NA 136, K5.2, CL 98, CO2 16, BUN CR 1.4 glucose 253, CA 7.8, mg 1.4 TP 6.1, albumin 3.4, T bili 0.4, AST 35, ALT 11, ALP 88 CPK 109, troponin < 0.01, BNP 2570(<1800) Procalcitonin 0.34, ferritin >1000 PT 11.0, INR 1.0, PTT 32.2, D-dimer 1579(<500) pH 7.30, PCO2 28, PO2 68 on 5 L/min [place] O2  CXR new bilateral pulmonary infiltrates consistent with patient's COVID-19 positive status After admission, patient keep complaining about severe right hip pain.  Assessment & Plan:     Principal Problem:   Acute hypoxemic respiratory failure due to severe acute respiratory syndrome coronavirus 2 (SARS-CoV-2) disease (HCC) Active Problems:   Coronary artery disease involving native coronary artery of native heart   Hypertensive heart disease with heart failure (HCC)   Hyperlipidemia   CKD (chronic kidney disease)   PAF (paroxysmal atrial fibrillation) (HCC)   Chronic diastolic heart failure (HCC)  Pneumonia due to COVID-19 virus, acute hypoxemic respiratory failure due to COVID-19: Continue to monitor due to significant symptoms, chest physiotherapy, incentive spirometry, deep breathing exercises, sputum induction, mucolytic's and bronchodilators. Supplemental oxygen to keep saturations more than 90%. Covid directed therapy with , steroids, dexamethasone remdesivir, day 2/5 antibiotics, started on cefepime due to multiple recent hospital visits.  Will continue and follow procalcitonin. Due to severity of symptoms, patient will need daily inflammatory markers, chest x-rays, liver function test to monitor and direct COVID-19 therapies.  Diabetes type 2: On insulin at home.  Continue insulin and cover with sliding scale.  Anticipate worsening with use of steroids.  Chronic diastolic heart failure: Fairly compensated.  Resume home torsemide.  Coronary artery disease: Currently denies any chest pain.  Continue nitrates.  CKD stage III: Renal functions at about baseline.  Continue to monitor.  Right hip pain: No history of trauma.  Has history of stretching.  No deformity, however tenderness on range of motion.  Adequate pain medications.  Right hip x-ray.  If negative, will do CT scan.  Paroxysmal A. fib with RVR: Treated with Cardizem drip in the emergency room.  Now  rate controlled in sinus rhythm.  She is not on any rate control medicine at home.  Will start on Cardizem if any recurrence. Used to be on aspirin, had recurrent severe GI bleed and taken off all  anticoagulation.  DVT prophylaxis: Lovenox subcu Code Status: DNR Family Communication: Patient's granddaughter was called and updated about the plan of care. Disposition Plan: Home after adequate improvement.   Consultants:   None  Procedures:   None  Antimicrobials:  Anti-infectives (From admission, onward)   Start     Dose/Rate Route Frequency Ordered Stop   09/28/19 1000  remdesivir 100 mg in sodium chloride 0.9 % 100 mL IVPB     100 mg 200 mL/hr over 30 Minutes Intravenous Daily 09/28/19 0201 10/02/19 0959   09/28/19 1000  ceFEPIme (MAXIPIME) 500 mg in dextrose 5 % 50 mL IVPB     500 mg 100 mL/hr over 30 Minutes Intravenous Every 12 hours 09/28/19 0739           Subjective: Patient seen and examined.  Admitted early morning by nighttime hospitalist.  Remains on 4 to 6 L of oxygen, has some dry cough.  She is more concerned about severe right hip pain and right thigh pain. No history of hip pain, however she might have some sprain or twisting few days ago.  Was able to manage walking on that hip.  Objective: Vitals:   09/28/19 0000 09/28/19 0408 09/28/19 0600 09/28/19 0840  BP: (!) 157/81 (!) 140/55  (!) 144/50  Pulse: 99 91  84  Resp: 20 16  18   Temp: 97.7 F (36.5 C) (!) 97.2 F (36.2 C)  (!) 97.2 F (36.2 C)  TempSrc: Oral Axillary  Oral  SpO2: 91% 91% 90% 93%  Weight: 58.8 kg     Height: 5\' 2"  (1.575 m)       Intake/Output Summary (Last 24 hours) at 09/28/2019 1121 Last data filed at 09/28/2019 0600 Gross per 24 hour  Intake 100 ml  Output 400 ml  Net -300 ml   Filed Weights   09/28/19 0000  Weight: 58.8 kg    Examination:  General exam: Appears anxious, in moderate distress with pain. Respiratory system: Clear to auscultation. Respiratory effort normal.  No added sounds. Cardiovascular system: S1 & S2 heard, RRR. No JVD, murmurs, rubs, gallops or clicks. No pedal edema. Gastrointestinal system: Abdomen is nondistended, soft and nontender. No  organomegaly or masses felt. Normal bowel sounds heard. Central nervous system: Alert and oriented. No focal neurological deficits. Extremities: Symmetric 5 x 5 power. Skin: No rashes, lesions or ulcers Psychiatry: Judgement and insight appear normal. Mood & affect anxious. Right hip, no deformity, right leg with no rotational deformity.  No localized tenderness. Range of motion restricted in all directions due to pain at the right hip joint.    Data Reviewed: I have personally reviewed following labs and imaging studies  CBC: Recent Labs  Lab 09/28/19 0409  WBC 4.6  NEUTROABS 4.0  HGB 8.8*  HCT 28.8*  MCV 93.2  PLT [id number]   Basic Metabolic Panel: Recent Labs  Lab 09/28/19 0409  NA 131*  K 4.8  CL 103  CO2 17*  GLUCOSE 293*  BUN 34*  CREATININE 1.11*  CALCIUM 8.2*   GFR: Estimated Creatinine Clearance: 26.6 mL/min (A) (by C-G formula based on SCr of 1.11 mg/dL (H)). Liver Function Tests: Recent Labs  Lab 09/28/19 0409  AST 22  ALT 12  ALKPHOS 60  BILITOT 0.4  PROT  6.7  ALBUMIN 2.5*   No results for input(s): LIPASE, AMYLASE in the last 168 hours. No results for input(s): AMMONIA in the last 168 hours. Coagulation Profile: No results for input(s): INR, PROTIME in the last 168 hours. Cardiac Enzymes: No results for input(s): CKTOTAL, CKMB, CKMBINDEX, TROPONINI in the last 168 hours. BNP (last 3 results) Recent Labs    05/14/19 1420  PROBNP 1,007*   HbA1C: No results for input(s): HGBA1C in the last 72 hours. CBG: No results for input(s): GLUCAP in the last 168 hours. Lipid Profile: No results for input(s): CHOL, HDL, LDLCALC, TRIG, CHOLHDL, LDLDIRECT in the last 72 hours. Thyroid Function Tests: No results for input(s): TSH, T4TOTAL, FREET4, T3FREE, THYROIDAB in the last 72 hours. Anemia Panel: Recent Labs    09/28/19 0409  FERRITIN 1,180*   Sepsis Labs: No results for input(s): PROCALCITON, LATICACIDVEN in the last 168 hours.  No results found  for this or any previous visit (from the past 240 hour(s)).       Radiology Studies: No results found.      Scheduled Meds: . HYDROmorphone      . amLODipine  10 mg Oral Daily  . vitamin C  500 mg Oral Daily  . ceFEPime (MAXIPIME) IV  500 mg Intravenous Q12H  . dexamethasone  6 mg Oral Q24H  . docusate sodium  100 mg Oral Daily  . enoxaparin (LOVENOX) injection  40 mg Subcutaneous Q24H  .  HYDROmorphone (DILAUDID) injection  0.5 mg Intravenous Once  . insulin aspart  0-20 Units Subcutaneous TID WC  . insulin aspart  0-5 Units Subcutaneous QHS  . insulin detemir  10 Units Subcutaneous Q2200  . Ipratropium-Albuterol  1 puff Inhalation Q6H  . isosorbide mononitrate  30 mg Oral TID  . torsemide  20 mg Oral BID  . zinc sulfate  220 mg Oral Daily   Continuous Infusions: . remdesivir 100 mg in NS 100 mL 100 mg (09/28/19 1037)     LOS: 0 days    Time spent: Additional 30 minutes    Barb Merino, MD [facility] Pager [phone]

## 2019-09-29 ENCOUNTER — Encounter (HOSPITAL_COMMUNITY): Payer: Self-pay | Admitting: Internal Medicine

## 2019-09-29 LAB — COMPREHENSIVE METABOLIC PANEL
ALT: 11 U/L (ref 0–44)
AST: 23 U/L (ref 15–41)
Albumin: 2.4 g/dL — ABNORMAL LOW (ref 3.5–5.0)
Alkaline Phosphatase: 60 U/L (ref 38–126)
Anion gap: 14 (ref 5–15)
BUN: 42 mg/dL — ABNORMAL HIGH (ref 8–23)
CO2: 17 mmol/L — ABNORMAL LOW (ref 22–32)
Calcium: 7.8 mg/dL — ABNORMAL LOW (ref 8.9–10.3)
Chloride: 102 mmol/L (ref 98–111)
Creatinine, Ser: 1.26 mg/dL — ABNORMAL HIGH (ref 0.44–1.00)
GFR calc Af Amer: 43 mL/min — ABNORMAL LOW (ref >60)
GFR calc non Af Amer: 37 mL/min — ABNORMAL LOW (ref >60)
Glucose, Bld: 263 mg/dL — ABNORMAL HIGH (ref 70–99)
Potassium: 5.3 mmol/L — ABNORMAL HIGH (ref 3.5–5.1)
Sodium: 133 mmol/L — ABNORMAL LOW (ref 135–145)
Total Bilirubin: 0.8 mg/dL (ref 0.3–1.2)
Total Protein: 6.2 g/dL — ABNORMAL LOW (ref 6.5–8.1)

## 2019-09-29 LAB — C-REACTIVE PROTEIN: CRP: 8.5 mg/dL — ABNORMAL HIGH (ref <1.0)

## 2019-09-29 LAB — CBC WITH DIFFERENTIAL/PLATELET
Abs Immature Granulocytes: 0.21 10*3/uL — ABNORMAL HIGH (ref 0.00–0.07)
Basophils Absolute: 0 10*3/uL (ref 0.0–0.1)
Basophils Relative: 0 %
Eosinophils Absolute: 0 10*3/uL (ref 0.0–0.5)
Eosinophils Relative: 0 %
HCT: 27.7 % — ABNORMAL LOW (ref 36.0–46.0)
Hemoglobin: 8.4 g/dL — ABNORMAL LOW (ref 12.0–15.0)
Immature Granulocytes: 4 %
Lymphocytes Relative: 4 %
Lymphs Abs: 0.2 10*3/uL — ABNORMAL LOW (ref 0.7–4.0)
MCH: 28.8 pg (ref 26.0–34.0)
MCHC: 30.3 g/dL (ref 30.0–36.0)
MCV: 94.9 fL (ref 80.0–100.0)
Monocytes Absolute: 0.2 10*3/uL (ref 0.1–1.0)
Monocytes Relative: 3 %
Neutro Abs: 5 10*3/uL (ref 1.7–7.7)
Neutrophils Relative %: 89 %
Platelets: 394 10*3/uL (ref 150–400)
RBC: 2.92 MIL/uL — ABNORMAL LOW (ref 3.87–5.11)
RDW: 16.9 % — ABNORMAL HIGH (ref 11.5–15.5)
WBC: 5.6 10*3/uL (ref 4.0–10.5)
nRBC: 0.7 % — ABNORMAL HIGH (ref 0.0–0.2)

## 2019-09-29 LAB — FERRITIN: Ferritin: 909 ng/mL — ABNORMAL HIGH (ref 11–307)

## 2019-09-29 LAB — GLUCOSE, CAPILLARY
Glucose-Capillary: 262 mg/dL — ABNORMAL HIGH (ref 70–99)
Glucose-Capillary: 280 mg/dL — ABNORMAL HIGH (ref 70–99)
Glucose-Capillary: 261 mg/dL — ABNORMAL HIGH (ref 70–99)
Glucose-Capillary: 75 mg/dL (ref 70–99)
Glucose-Capillary: 303 mg/dL — ABNORMAL HIGH (ref 70–99)

## 2019-09-29 LAB — D-DIMER, QUANTITATIVE: D-Dimer, Quant: 1.08 ug/mL-FEU — ABNORMAL HIGH (ref 0.00–0.50)

## 2019-09-29 MED ORDER — SODIUM CHLORIDE 0.9 % IV SOLN
2.0000 g | INTRAVENOUS | Status: DC
  Administered 2019-09-29 – 2019-10-01 (×3): 2 g via INTRAVENOUS
  Filled 2019-09-29 (×3): qty 2

## 2019-09-29 MED ORDER — QUETIAPINE FUMARATE 25 MG PO TABS
50.0000 mg | ORAL_TABLET | Freq: Every day | ORAL | Status: DC
  Administered 2019-09-30: 01:00:00 50 mg via ORAL
  Filled 2019-09-29: qty 2

## 2019-09-29 MED ORDER — ENOXAPARIN SODIUM 30 MG/0.3ML ~~LOC~~ SOLN
30.0000 mg | SUBCUTANEOUS | Status: DC
  Administered 2019-09-29 – 2019-10-01 (×3): 30 mg via SUBCUTANEOUS
  Filled 2019-09-29 (×3): qty 0.3

## 2019-09-29 MED ORDER — SODIUM CHLORIDE 0.9 % IV SOLN
1.0000 g | INTRAVENOUS | Status: DC
  Filled 2019-09-29: qty 1

## 2019-09-29 NOTE — Progress Notes (Signed)
PHARMACY NOTE:  ANTIMICROBIAL RENAL DOSAGE ADJUSTMENT  Current antimicrobial regimen includes a mismatch between antimicrobial dosage and estimated renal function.  As per policy approved by the Pharmacy & Therapeutics and Medical Executive Committees, the antimicrobial dosage will be adjusted accordingly.  Current antimicrobial dosage:  Cefepime 1gm IV q12h  Indication: HCAP  Renal Function:  Estimated Creatinine Clearance: 26.6 mL/min (A) (by C-G formula based on SCr of 1.11 mg/dL (H)).     Antimicrobial dosage has been changed to:  Cefepime 2gm IV q24h    Thank you for allowing pharmacy to be a part of this patient's care.  Sherlon Handing, PharmD, BCPS Please see amion for complete clinical pharmacist phone list 09/29/2019 12:08 AM

## 2019-09-29 NOTE — Progress Notes (Signed)
PROGRESS NOTE    Regina Herrera  [id number] DOB: July 27, 1927 DOA: 09/28/2019 PCP: Marco Collie, MD    Brief Narrative:  From the admission notes: Regina Herrera  is a 86 y.o. female, with IDDM, paroxysmal A. fib, HTN, HLD, HFpEF, who presents as a transfer from [facility] where she presented with worsening shortness of breath, generalized weakness, myalgias, found to be in A. fib with RVR as well as acute respiratory failure with hypoxia.  Patient reports she has not felt well for the last 6 months, she has had intermittent GIB, she has had multiple blood transfusions.  Over the past week she has been having worsening shortness of breath as well as generalized weakness, found to be anemic and received blood transfusion, she did not improve significantly therefore she was tested for SARS-CoV-2 and found to be positive on 09/24/2019. She was started on albuterol HandiHaler, the symptoms progressively worsened prompting her family to call EMS.   Upon EMS arrival sats were in the 70s and she was transported on nonrebreather.  She admits to intermittent fevers with chills.  She has a cough which is nonproductive.  She denies any orthopnea, PND, lower extremity edema.  No chest pain.  No hemoptysis.  She denies any hematemesis, hematochezia or melena. Patient was transferred to [place] on Cardizem drip and supplemental oxygen.  Records from [facility]: WBC 6.9, Hgb 8.9, HCT 27.7, PLT 273 NA 136, K5.2, CL 98, CO2 16, BUN CR 1.4 glucose 253, CA 7.8, mg 1.4 TP 6.1, albumin 3.4, T bili 0.4, AST 35, ALT 11, ALP 88 CPK 109, troponin < 0.01, BNP 2570(<1800) Procalcitonin 0.34, ferritin >1000 PT 11.0, INR 1.0, PTT 32.2, D-dimer 1579(<500) pH 7.30, PCO2 28, PO2 68 on 5 L/min [place] O2  CXR new bilateral pulmonary infiltrates consistent with patient's COVID-19 positive status After admission, patient keep complaining about severe right hip pain.  Assessment & Plan:     Principal Problem:   Acute hypoxemic respiratory failure due to severe acute respiratory syndrome coronavirus 2 (SARS-CoV-2) disease (HCC) Active Problems:   Coronary artery disease involving native coronary artery of native heart   Hypertensive heart disease with heart failure (HCC)   Hyperlipidemia   CKD (chronic kidney disease)   PAF (paroxysmal atrial fibrillation) (HCC)   Chronic diastolic heart failure (HCC)  Pneumonia due to COVID-19 virus, acute hypoxemic respiratory failure due to COVID-19: Continue to monitor due to significant symptoms, chest physiotherapy, incentive spirometry, deep breathing exercises, sputum induction, mucolytic's and bronchodilators. Supplemental oxygen to keep saturations more than 90%. Covid directed therapy with , steroids, dexamethasone remdesivir, day 3/5 antibiotics, started on cefepime due to multiple recent hospital visits.  Will continue Due to severity of symptoms, patient will need daily inflammatory markers, chest x-rays, liver function test to monitor and direct COVID-19 therapies.  Diabetes type 2: On insulin at home.  Continue insulin and cover with sliding scale.  Anticipate worsening with use of steroids.  Chronic diastolic heart failure: Fairly compensated.  Resume home torsemide.  Coronary artery disease: Currently denies any chest pain.  Continue nitrates.  AKI CKD stage III: With hyperkalemia.  Renal functions fluctuate.  Continue to monitor.  Right hip/ thigh pain: No history of trauma.  Hip x-ray with no evidence of fracture or dislocation.  Start mobilizing today.  Adequate pain medications.  Paroxysmal A. fib with RVR: Treated with Cardizem drip in the emergency room.  Now rate controlled in sinus rhythm.  She is not on any  rate control medicine at home.  Will start on Cardizem if any recurrence. Used to be on aspirin, had recurrent severe GI bleed and taken off all anticoagulation.  DVT prophylaxis: Lovenox subcu Code  Status: DNR Family Communication: Patient's granddaughter was called and updated about the plan of care 1/10. Disposition Plan: Home after adequate improvement.   Consultants:   None  Procedures:   None  Antimicrobials:  Anti-infectives (From admission, onward)   Start     Dose/Rate Route Frequency Ordered Stop   09/29/19 1000  ceFEPIme (MAXIPIME) 2 g in sodium chloride 0.9 % 100 mL IVPB     2 g 200 mL/hr over 30 Minutes Intravenous Every 24 hours 09/29/19 0006     09/29/19 0015  ceFEPIme (MAXIPIME) 1 g in sodium chloride 0.9 % 100 mL IVPB  Status:  Discontinued     1 g 200 mL/hr over 30 Minutes Intravenous NOW 09/29/19 0006 09/29/19 0128   09/28/19 2200  ceFEPIme (MAXIPIME) 1 g in sodium chloride 0.9 % 100 mL IVPB  Status:  Discontinued     1 g 200 mL/hr over 30 Minutes Intravenous Every 12 hours 09/28/19 1411 09/29/19 0006   09/28/19 1000  remdesivir 100 mg in sodium chloride 0.9 % 100 mL IVPB     100 mg 200 mL/hr over 30 Minutes Intravenous Daily 09/28/19 0201 10/02/19 0959   09/28/19 1000  ceFEPIme (MAXIPIME) 500 mg in dextrose 5 % 50 mL IVPB  Status:  Discontinued     500 mg 100 mL/hr over 30 Minutes Intravenous Every 12 hours 09/28/19 0739 09/28/19 1411         Subjective: Seen and examined.  Overnight on increasing oxygen requirement, however now on 4 L.  Does not feel well.  Afebrile.  Pain persist on right hip and right thigh.  Dilaudid is helping.  Objective: Vitals:   09/28/19 2312 09/29/19 0400 09/29/19 0423 09/29/19 0800  BP:  (!) 141/49  138/61  Pulse:  94  76  Resp:  13  19  Temp:   98.2 F (36.8 C) 99.1 F (37.3 C)  TempSrc:   Axillary Oral  SpO2: 91% 90%  94%  Weight:      Height:        Intake/Output Summary (Last 24 hours) at 09/29/2019 1416 Last data filed at 09/29/2019 0500 Gross per 24 hour  Intake 680 ml  Output 700 ml  Net -20 ml   Filed Weights   09/28/19 0000  Weight: 58.8 kg    Examination:  General exam: Appears anxious, in  mild distress with pain and anxiety. Respiratory system: Clear to auscultation. Respiratory effort normal.  No added sounds. Cardiovascular system: S1 & S2 heard, RRR. No JVD, murmurs, rubs, gallops or clicks. No pedal edema. Gastrointestinal system: Abdomen is nondistended, soft and nontender. No organomegaly or masses felt. Normal bowel sounds heard. Central nervous system: Alert and oriented. No focal neurological deficits. Extremities: Symmetric 5 x 5 power. Skin: No rashes, lesions or ulcers Psychiatry: Judgement and insight appear normal. Mood & affect anxious. Right hip, no deformity, right leg with no rotational deformity.  No localized tenderness.   Data Reviewed: I have personally reviewed following labs and imaging studies  CBC: Recent Labs  Lab 09/28/19 0409 09/29/19 0531  WBC 4.6 5.6  NEUTROABS 4.0 5.0  HGB 8.8* 8.4*  HCT 28.8* 27.7*  MCV 93.2 94.9  PLT 315 [id number]   Basic Metabolic Panel: Recent Labs  Lab 09/28/19 0409 09/29/19 0531  NA  131* 133*  K 4.8 5.3*  CL 103 102  CO2 17* 17*  GLUCOSE 293* 263*  BUN 34* 42*  CREATININE 1.11* 1.26*  CALCIUM 8.2* 7.8*   GFR: Estimated Creatinine Clearance: 23.5 mL/min (A) (by C-G formula based on SCr of 1.26 mg/dL (H)). Liver Function Tests: Recent Labs  Lab 09/28/19 0409 09/29/19 0531  AST 22 23  ALT 12 11  ALKPHOS 60 60  BILITOT 0.4 0.8  PROT 6.7 6.2*  ALBUMIN 2.5* 2.4*   No results for input(s): LIPASE, AMYLASE in the last 168 hours. No results for input(s): AMMONIA in the last 168 hours. Coagulation Profile: No results for input(s): INR, PROTIME in the last 168 hours. Cardiac Enzymes: No results for input(s): CKTOTAL, CKMB, CKMBINDEX, TROPONINI in the last 168 hours. BNP (last 3 results) Recent Labs    05/14/19 1420  PROBNP 1,007*   HbA1C: Recent Labs    09/28/19 0409  HGBA1C 6.0*   CBG: Recent Labs  Lab 09/28/19 1119 09/28/19 1536 09/28/19 2144 09/29/19 0836 09/29/19 1145  GLUCAP 240*  99 147* 280* 261*   Lipid Profile: No results for input(s): CHOL, HDL, LDLCALC, TRIG, CHOLHDL, LDLDIRECT in the last 72 hours. Thyroid Function Tests: No results for input(s): TSH, T4TOTAL, FREET4, T3FREE, THYROIDAB in the last 72 hours. Anemia Panel: Recent Labs    09/28/19 0409 09/29/19 0531  FERRITIN 1,180* 909*   Sepsis Labs: No results for input(s): PROCALCITON, LATICACIDVEN in the last 168 hours.  No results found for this or any previous visit (from the past 240 hour(s)).       Radiology Studies: DG HIPS BILAT WITH PELVIS 2V  Result Date: 09/28/2019 CLINICAL DATA:  Right hip pain.  No reported injury. EXAM: DG HIP (WITH OR WITHOUT PELVIS) 2V BILAT COMPARISON:  None. FINDINGS: No fracture. No right hip dislocation. No suspicious focal osseous lesions. Mild right hip osteoarthritis. Scattered surgical clips overlying the right iliac wing and right groin. IMPRESSION: No acute osseous abnormality. No right hip dislocation. Mild right hip osteoarthritis. Electronically Signed   By: Ilona Sorrel M.D.   On: 09/28/2019 17:07        Scheduled Meds: . amLODipine  10 mg Oral Daily  . vitamin C  500 mg Oral Daily  . dexamethasone  6 mg Oral Q24H  . docusate sodium  100 mg Oral Daily  . enoxaparin (LOVENOX) injection  30 mg Subcutaneous Q24H  . insulin aspart  0-20 Units Subcutaneous TID WC  . insulin aspart  0-5 Units Subcutaneous QHS  . insulin detemir  10 Units Subcutaneous Q2200  . Ipratropium-Albuterol  1 puff Inhalation Q6H  . isosorbide mononitrate  30 mg Oral TID  . lidocaine  1 patch Transdermal Q24H  . torsemide  20 mg Oral BID  . zinc sulfate  220 mg Oral Daily   Continuous Infusions: . ceFEPime (MAXIPIME) IV 2 g (09/29/19 1014)  . remdesivir 100 mg in NS 100 mL 100 mg (09/29/19 1016)     LOS: 1 day    Time spent: 30 minutes    Barb Merino, MD [facility] Pager [phone]

## 2019-09-29 NOTE — Evaluation (Signed)
Physical Therapy Evaluation Patient Details Name: Regina Herrera MRN: [id number] DOB: Nov 08, 1926 Today's Date: 09/29/2019   History of Present Illness  From the admission notes: Regina Herrera  is a 86 y.o. female, with IDDM, paroxysmal A. fib, HTN, HLD, HFpEF, who presents as a transfer from [facility] where she presented with worsening shortness of breath, generalized weakness, myalgias, found to be in A. fib with RVR as well as acute respiratory failure with hypoxia.  Patient reports she has not felt well for the last 6 months, she has had intermittent GIB, she has had multiple blood transfusions.  Over the past week she has been having worsening shortness of breath as well as generalized weakness, found to be anemic and received blood transfusion, she did not improve significantly therefore she was tested for SARS-CoV-2 and found to be positive on 09/24/2019  Clinical Impression  The patient presents with significant weakness. PTA, patient lived alone and was independent. Per family, patient will not have 24/7 caregivers availalble. Patient's SPO2 on 5 L >87% with mobility. Patient reports pain/discomfort right thigh. Reports no falls PTA. Pt admitted with above diagnosis.  Pt currently with functional limitations due to the deficits listed below (see PT Problem List). Pt will benefit from skilled PT to increase their independence and safety with mobility to allow discharge to the venue listed below.       Follow Up Recommendations SNF (all family  members work)    Optician, dispensing  None recommended by PT    Recommendations for Other Services   OT    Precautions / Restrictions Precautions Precaution Comments: incontinent, take a depends- sm/med,monitor sats      Mobility  Bed Mobility Overal bed mobility: Needs Assistance Bed Mobility: Rolling;Sidelying to Sit Rolling: Mod assist Sidelying to sit: Mod assist       General bed mobility comments: extra time ,  assist trunk  Transfers Overall transfer level: Needs assistance   Transfers: Sit to/from Stand;Stand Pivot Transfers Sit to Stand: Mod assist Stand pivot transfers: Mod assist       General transfer comment: bear hug support to stand and take small steps to recliner, incontinent of urine when pivoting  Ambulation/Gait                Stairs            Wheelchair Mobility    Modified Rankin (Stroke Patients Only)       Balance Overall balance assessment: Needs assistance Sitting-balance support: Bilateral upper extremity supported;Feet supported Sitting balance-Leahy Scale: Fair     Standing balance support: During functional activity;Bilateral upper extremity supported Standing balance-Leahy Scale: Poor Standing balance comment: reliant on support                             Pertinent Vitals/Pain Pain Assessment: Faces Faces Pain Scale: Hurts even more Pain Location: right leg Pain Descriptors / Indicators: Aching;Discomfort Pain Intervention(s): Monitored during session;Limited activity within patient's tolerance    Home Living Family/patient expects to be discharged to:: Private residence   Available Help at Discharge: Family Type of Home: Apartment Home Access: Stairs to enter Entrance Stairs-Rails: Psychiatric nurse of Steps: 3 Home Layout: One level Home Equipment: Environmental consultant - 4 wheels;Shower seat      Prior Function Level of Independence: Independent         Comments: per patient, until she got sick, still drove,     Hand Dominance  Dominant Hand: Right    Extremity/Trunk Assessment   Upper Extremity Assessment Upper Extremity Assessment: Generalized weakness    Lower Extremity Assessment Lower Extremity Assessment: Generalized weakness    Cervical / Trunk Assessment Cervical / Trunk Assessment: Kyphotic  Communication   Communication: HOH  Cognition Arousal/Alertness: Awake/alert Behavior  During Therapy: WFL for tasks assessed/performed;Restless Overall Cognitive Status: No family/caregiver present to determine baseline cognitive functioning Area of Impairment: Orientation                 Orientation Level: Time             General Comments: patient oriented to [place], she has covid. lives in [place].      General Comments      Exercises Other Exercises Other Exercises: Flutter ,x 10- minimal effort   Assessment/Plan    PT Assessment Patient needs continued PT services  PT Problem List Decreased strength       PT Treatment Interventions DME instruction;Therapeutic activities;Gait training;Therapeutic exercise;Functional mobility training;Patient/family education    PT Goals (Current goals can be found in the Care Plan section)  Acute Rehab PT Goals Patient Stated Goal: to get strong and go home PT Goal Formulation: With patient/family Time For Goal Achievement: 10/13/19 Potential to Achieve Goals: Fair    Frequency Min 2X/week   Barriers to discharge Decreased caregiver support      Co-evaluation               AM-PAC PT "6 Clicks" Mobility  Outcome Measure Help needed turning from your back to your side while in a flat bed without using bedrails?: A Lot Help needed moving from lying on your back to sitting on the side of a flat bed without using bedrails?: A Lot Help needed moving to and from a bed to a chair (including a wheelchair)?: A Lot Help needed standing up from a chair using your arms (e.g., wheelchair or bedside chair)?: A Lot Help needed to walk in hospital room?: Total Help needed climbing 3-5 steps with a railing? : Total 6 Click Score: 10    End of Session Equipment Utilized During Treatment: Oxygen Activity Tolerance: Patient limited by fatigue Patient left: in chair;with call bell/phone within reach;with chair alarm set Nurse Communication: Mobility status PT Visit Diagnosis: Unsteadiness on feet (R26.81);Difficulty  in walking, not elsewhere classified (R26.2)    Time: [id number] PT Time Calculation (min) (ACUTE ONLY): 45 min   Charges:   PT Evaluation $PT Eval Moderate Complexity: 1 Mod PT Treatments $Therapeutic Activity: 8-22 mins $Self Care/Home Management: [place] Pager [phone] Office [phone]   Claretha Cooper 09/29/2019, 2:12 PM

## 2019-09-30 ENCOUNTER — Inpatient Hospital Stay (HOSPITAL_COMMUNITY): Payer: PPO

## 2019-09-30 LAB — CBC WITH DIFFERENTIAL/PLATELET
Abs Immature Granulocytes: 0.2 10*3/uL — ABNORMAL HIGH (ref 0.00–0.07)
Basophils Absolute: 0 10*3/uL (ref 0.0–0.1)
Basophils Relative: 0 %
Eosinophils Absolute: 0 10*3/uL (ref 0.0–0.5)
Eosinophils Relative: 0 %
HCT: 28 % — ABNORMAL LOW (ref 36.0–46.0)
Hemoglobin: 8.4 g/dL — ABNORMAL LOW (ref 12.0–15.0)
Immature Granulocytes: 3 %
Lymphocytes Relative: 5 %
Lymphs Abs: 0.3 10*3/uL — ABNORMAL LOW (ref 0.7–4.0)
MCH: 28.3 pg (ref 26.0–34.0)
MCHC: 30 g/dL (ref 30.0–36.0)
MCV: 94.3 fL (ref 80.0–100.0)
Monocytes Absolute: 0.4 10*3/uL (ref 0.1–1.0)
Monocytes Relative: 6 %
Neutro Abs: 6.4 10*3/uL (ref 1.7–7.7)
Neutrophils Relative %: 86 %
Platelets: 496 10*3/uL — ABNORMAL HIGH (ref 150–400)
RBC: 2.97 MIL/uL — ABNORMAL LOW (ref 3.87–5.11)
RDW: 16.8 % — ABNORMAL HIGH (ref 11.5–15.5)
WBC: 7.4 10*3/uL (ref 4.0–10.5)
nRBC: 1.1 % — ABNORMAL HIGH (ref 0.0–0.2)

## 2019-09-30 LAB — COMPREHENSIVE METABOLIC PANEL
ALT: 13 U/L (ref 0–44)
AST: 30 U/L (ref 15–41)
Albumin: 2.4 g/dL — ABNORMAL LOW (ref 3.5–5.0)
Alkaline Phosphatase: 62 U/L (ref 38–126)
Anion gap: 12 (ref 5–15)
BUN: 55 mg/dL — ABNORMAL HIGH (ref 8–23)
CO2: 19 mmol/L — ABNORMAL LOW (ref 22–32)
Calcium: 8.8 mg/dL — ABNORMAL LOW (ref 8.9–10.3)
Chloride: 99 mmol/L (ref 98–111)
Creatinine, Ser: 1.49 mg/dL — ABNORMAL HIGH (ref 0.44–1.00)
GFR calc Af Amer: 35 mL/min — ABNORMAL LOW (ref >60)
GFR calc non Af Amer: 31 mL/min — ABNORMAL LOW (ref >60)
Glucose, Bld: 472 mg/dL — ABNORMAL HIGH (ref 70–99)
Potassium: 4.6 mmol/L (ref 3.5–5.1)
Sodium: 130 mmol/L — ABNORMAL LOW (ref 135–145)
Total Bilirubin: 0.1 mg/dL — ABNORMAL LOW (ref 0.3–1.2)
Total Protein: 6.1 g/dL — ABNORMAL LOW (ref 6.5–8.1)

## 2019-09-30 LAB — GLUCOSE, CAPILLARY
Glucose-Capillary: 433 mg/dL — ABNORMAL HIGH (ref 70–99)
Glucose-Capillary: 443 mg/dL — ABNORMAL HIGH (ref 70–99)
Glucose-Capillary: 214 mg/dL — ABNORMAL HIGH (ref 70–99)
Glucose-Capillary: 44 mg/dL — CL (ref 70–99)
Glucose-Capillary: 119 mg/dL — ABNORMAL HIGH (ref 70–99)
Glucose-Capillary: 170 mg/dL — ABNORMAL HIGH (ref 70–99)

## 2019-09-30 LAB — C-REACTIVE PROTEIN: CRP: 4.5 mg/dL — ABNORMAL HIGH (ref <1.0)

## 2019-09-30 LAB — D-DIMER, QUANTITATIVE: D-Dimer, Quant: 1.33 ug/mL-FEU — ABNORMAL HIGH (ref 0.00–0.50)

## 2019-09-30 LAB — FERRITIN: Ferritin: 731 ng/mL — ABNORMAL HIGH (ref 11–307)

## 2019-09-30 MED ORDER — HYDROMORPHONE HCL 1 MG/ML IJ SOLN
0.5000 mg | INTRAMUSCULAR | Status: DC | PRN
  Administered 2019-09-30: 0.5 mg via INTRAVENOUS
  Filled 2019-09-30: qty 0.5

## 2019-09-30 MED ORDER — DEXTROSE 50 % IV SOLN
INTRAVENOUS | Status: AC
  Filled 2019-09-30: qty 50

## 2019-09-30 MED ORDER — INSULIN ASPART 100 UNIT/ML ~~LOC~~ SOLN: 0.0000 [IU] | Freq: Three times a day (TID) | SUBCUTANEOUS | Status: DC

## 2019-09-30 MED ORDER — LORAZEPAM 2 MG/ML IJ SOLN
0.5000 mg | INTRAMUSCULAR | Status: DC | PRN
  Filled 2019-09-30: qty 1

## 2019-09-30 MED ORDER — HALOPERIDOL LACTATE 5 MG/ML IJ SOLN
2.0000 mg | Freq: Four times a day (QID) | INTRAMUSCULAR | Status: DC | PRN
  Administered 2019-09-30 – 2019-10-01 (×3): 2 mg via INTRAVENOUS
  Filled 2019-09-30 (×3): qty 1

## 2019-09-30 MED ORDER — INSULIN DETEMIR 100 UNIT/ML ~~LOC~~ SOLN
8.0000 [IU] | Freq: Two times a day (BID) | SUBCUTANEOUS | Status: DC
  Filled 2019-09-30: qty 0.08

## 2019-09-30 MED ORDER — INSULIN ASPART 100 UNIT/ML ~~LOC~~ SOLN: 6.0000 [IU] | Freq: Three times a day (TID) | SUBCUTANEOUS | Status: DC

## 2019-09-30 MED ORDER — INSULIN ASPART 100 UNIT/ML ~~LOC~~ SOLN
0.0000 [IU] | Freq: Three times a day (TID) | SUBCUTANEOUS | Status: DC
  Administered 2019-09-30: 13:00:00 20 [IU] via SUBCUTANEOUS

## 2019-09-30 MED ORDER — MORPHINE SULFATE (PF) 2 MG/ML IV SOLN: 2.0000 mg | INTRAVENOUS | Status: DC | PRN

## 2019-09-30 NOTE — Progress Notes (Signed)
Occupational Therapy Evaluation Patient Details Name: Regina Herrera MRN: [id number] DOB: [birth year] Today's Date: 09/30/2019    History of Present Illness From the admission notes: Regina Herrera  is a 86 y.o. female, with IDDM, paroxysmal A. fib, HTN, HLD, HFpEF, who presents as a transfer from [facility] where she presented with worsening shortness of breath, generalized weakness, myalgias, found to be in A. fib with RVR as well as acute respiratory failure with hypoxia.  Patient reports she has not felt well for the last 6 months, she has had intermittent GIB, she has had multiple blood transfusions.  Over the past week she has been having worsening shortness of breath as well as generalized weakness, found to be anemic and received blood transfusion, she did not improve significantly therefore she was tested for SARS-CoV-2 and found to be positive on 09/24/2019   Clinical Impression   PTA, pt lived alone and was independent, driving and doing her own shopping. Pt with a change in status from yesterday. CT negative. Pt inconsistently following commands -able to assist in rolling in bed. Pt verbalized need to go to the bathroom, however pt incontinent before being able to progress to [facility]. Pt with rigid jerking type movment. Difficulty with word finding, however concentrating to try to find the correct phrases "I have to go to the bathroom". Pt will need SNF level rehab. Will follow acutely.     Follow Up Recommendations  SNF;Supervision/Assistance - 24 hour    Equipment Recommendations  3 in 1 bedside commode    Recommendations for Other Services Speech consult     Precautions / Restrictions Precautions Precautions: Fall      Mobility Bed Mobility Overal bed mobility: Needs Assistance Bed Mobility: Rolling Rolling: Max assist Sidelying to sit: Max assist(attempting; pt with jerking rigid movemetns and posterior pu)          Transfers                 General  transfer comment: not attempted. Will need +2 assist for safety    Balance Overall balance assessment: Needs assistance   Sitting balance-Leahy Scale: Zero                                     ADL either performed or assessed with clinical judgement   ADL Overall ADL's : Needs assistance/impaired                                     Functional mobility during ADLs: Maximal assistance(will need @ to progress) General ADL Comments: total A at this time     Vision   Additional Comments: unsure; dysconjugate gaze with L eye turned inward; may be congenital; pt resonds "No" when asked if she was seeing double     Perception Perception Comments: poor spatial awareness; will further assess   Praxis Praxis Praxis tested?: Deficits Deficits: Initiation;Organization    Pertinent Vitals/Pain Pain Assessment: Faces Faces Pain Scale: Hurts little more Pain Location: general discomfort Pain Descriptors / Indicators: Grimacing;Guarding Pain Intervention(s): Limited activity within patient's tolerance     Hand Dominance Right   Extremity/Trunk Assessment Upper Extremity Assessment Upper Extremity Assessment: Generalized weakness;RUE deficits/detail;LUE deficits/detail RUE Deficits / Details: jerking movements; difficulty using functionally LUE Deficits / Details: jerking movemetns; difficulty using functionally but raeaching toward rail  Lower Extremity Assessment Lower Extremity Assessment: Defer to PT evaluation   Cervical / Trunk Assessment Cervical / Trunk Assessment: Kyphotic   Communication Communication Communication: HOH   Cognition Arousal/Alertness: Lethargic Behavior During Therapy: Restless;Anxious Overall Cognitive Status: Impaired/Different from baseline Area of Impairment: Orientation;Attention;Memory;Following commands;Safety/judgement;Awareness;Problem solving                 Orientation Level: Disoriented  to;Place;Time;Situation(unable to give name) Current Attention Level: Focused Memory: Decreased short-term memory Following Commands: Follows one step commands inconsistently Safety/Judgement: Decreased awareness of safety;Decreased awareness of deficits Awareness: Intellectual Problem Solving: Slow processing;Decreased initiation;Difficulty sequencing;Requires verbal cues;Requires tactile cues General Comments: Able to help roll to side   General Comments       Exercises     Shoulder Instructions      Home Living Family/patient expects to be discharged to:: Private residence Living Arrangements: Alone Available Help at Discharge: Family Type of Home: Apartment Home Access: Stairs to enter Technical brewer of Steps: 3 Entrance Stairs-Rails: Right;Left Home Layout: One level     Bathroom Shower/Tub: Walk-in shower         Home Equipment: Environmental consultant - 4 wheels;Shower seat   Additional Comments: information from chart      Prior Functioning/Environment          Comments: per patient, until she got sick, still drove,        OT Problem List: Decreased strength;Decreased range of motion;Decreased activity tolerance;Impaired balance (sitting and/or standing);Impaired vision/perception;Decreased coordination;Decreased cognition;Decreased safety awareness;Decreased knowledge of use of DME or AE;Decreased knowledge of precautions;Cardiopulmonary status limiting activity;Impaired UE functional use;Pain      OT Treatment/Interventions: Self-care/ADL training;Therapeutic exercise;Neuromuscular education;Energy conservation;DME and/or AE instruction;Therapeutic activities;Cognitive remediation/compensation;Visual/perceptual remediation/compensation;Patient/family education;Balance training    OT Goals(Current goals can be found in the care plan section) Acute Rehab OT Goals Patient Stated Goal: Pt unable OT Goal Formulation: Patient unable to participate in goal  setting Time For Goal Achievement: 10/14/19 Potential to Achieve Goals: Fair  OT Frequency: Min 2X/week   Barriers to D/C:            Co-evaluation              AM-PAC OT "6 Clicks" Daily Activity     Outcome Measure Help from another person eating meals?: Total Help from another person taking care of personal grooming?: Total Help from another person toileting, which includes using toliet, bedpan, or urinal?: Total Help from another person bathing (including washing, rinsing, drying)?: Total Help from another person to put on and taking off regular upper body clothing?: Total Help from another person to put on and taking off regular lower body clothing?: Total 6 Click Score: 6   End of Session Equipment Utilized During Treatment: Oxygen Nurse Communication: Mobility status  Activity Tolerance: Patient limited by lethargy Patient left: in bed;with call bell/phone within reach;with bed alarm set;Other (comment)(bed pad alarm placed)  OT Visit Diagnosis: Unsteadiness on feet (R26.81);Other abnormalities of gait and mobility (R26.89);Muscle weakness (generalized) (M62.81);Other symptoms and signs involving cognitive function;Feeding difficulties (R63.3);Pain Pain - part of body: (generalized)                Time: 1355-1415 OT Time Calculation (min): 20 min Charges:  OT General Charges $OT Visit: 1 Visit OT Evaluation $OT Eval Moderate Complexity: [place], OT/L   Acute OT Clinical Specialist Acute Rehabilitation Services Pager [phone] Office [phone]   [facility] 09/30/2019, 2:33 PM

## 2019-09-30 NOTE — Progress Notes (Signed)
Patient remains confused and delirious. I helped her grand daughter( Ms Elsie Saas hospice nursing director)  to video chat with her. Patient could only repeatedly call her name , could not verbalize any other concerns. We discussed about further intervention in light of no  Obvious reversible causes and encephalopathy due to covid infection with underlying chronic medical illnesses.   Plan: No escalation of care Morphine/ ativan for comfort if needed  If further deteriorates , consult hospice . Patient wishes to die at [facility] where her grand daughter works. Had hypoglycemic events, will correct

## 2019-09-30 NOTE — NC FL2 (Signed)
[place] LEVEL OF CARE SCREENING TOOL     IDENTIFICATION  Patient Name: Regina Herrera Birthdate: 1927-05-07 Sex: female Admission Date (Current Location): 09/28/2019  [facility] and [place] Number:  Herbalist and Address:  [facility], [place] [address], [place], [place] [address]      Provider Number: [id number]  Attending Physician Name and Address:  Barb Merino, MD  Relative Name and Phone Number:       Current Level of Care: Hospital Recommended Level of Care: [place] Prior Approval Number:    Date Approved/Denied:   PASRR Number: [id number]  Discharge Plan: SNF    Current Diagnoses: Patient Active Problem List   Diagnosis Date Noted  . Acute hypoxemic respiratory failure due to severe acute respiratory syndrome coronavirus 2 (SARS-CoV-2) disease ([place]) 09/28/2019  . Chronic diastolic heart failure ([place]) 12/20/2017  . Hx of CABG 05/09/2017  . CKD (chronic kidney disease) 05/08/2017  . DM (diabetes mellitus) ([place]) 05/08/2017  . PAF (paroxysmal atrial fibrillation) ([place]) 05/08/2017  . Sinus bradycardia 05/08/2017  . Syncope 05/08/2017  . Anemia 05/08/2017  . Iron deficiency anemia 06/10/2015  . LVH (left ventricular hypertrophy) 06/10/2015  . Coronary artery disease involving native coronary artery of native heart 06/09/2015  . Hypertensive heart disease with heart failure ([place]) 06/09/2015  . Hyperlipidemia 06/09/2015    Orientation RESPIRATION BLADDER Height & Weight     Self, Time, Situation, Place  O2(see dc summary) Incontinent Weight: 129 lb 11.2 oz (58.8 kg) Height:  5\' 2"  (157.5 cm)  BEHAVIORAL SYMPTOMS/MOOD NEUROLOGICAL BOWEL NUTRITION STATUS      Continent Diet(see dc summary)  AMBULATORY STATUS COMMUNICATION OF NEEDS Skin   Extensive Assist Verbally Normal                       Personal Care Assistance Level of Assistance  Bathing, Dressing, Feeding Bathing  Assistance: Limited assistance Feeding assistance: Independent Dressing Assistance: Limited assistance     Functional Limitations Info  Sight, Hearing, Speech Sight Info: Adequate Hearing Info: Adequate Speech Info: Adequate    SPECIAL CARE FACTORS FREQUENCY  PT (By licensed PT), OT (By licensed OT)     PT Frequency: 5 times weekly OT Frequency: 3 times weekly            Contractures Contractures Info: Not present    Additional Factors Info  Code Status, Allergies Code Status Info: DNR Allergies Info: Ciprofloxacin, Lipitor, Codeine           Current Medications (09/30/2019):  This is the current hospital active medication list Current Facility-Administered Medications  Medication Dose Route Frequency Provider Last Rate Last Admin  . acetaminophen (TYLENOL) tablet 650 mg  650 mg Oral Q6H PRN Peyton Bottoms, MD   650 mg at 09/28/19 0559  . amLODipine (NORVASC) tablet 10 mg  10 mg Oral Daily Peyton Bottoms, MD   10 mg at 09/29/19 1011  . ascorbic acid (VITAMIN C) tablet 500 mg  500 mg Oral Daily Peyton Bottoms, MD   500 mg at 09/29/19 1011  . ceFEPIme (MAXIPIME) 2 g in sodium chloride 0.9 % 100 mL IVPB  2 g Intravenous Q24H Franky Macho, RPH 200 mL/hr at 09/29/19 1014 2 g at 09/29/19 1014  . chlorpheniramine-HYDROcodone (TUSSIONEX) 10-8 MG/5ML suspension 5 mL  5 mL Oral Q12H PRN Peyton Bottoms, MD      . dexamethasone (DECADRON) tablet 6 mg  6 mg Oral Q24H  Peyton Bottoms, MD   6 mg at 09/29/19 1755  . docusate sodium (COLACE) capsule 100 mg  100 mg Oral Daily Peyton Bottoms, MD   100 mg at 09/29/19 1016  . enoxaparin (LOVENOX) injection 30 mg  30 mg Subcutaneous Q24H Franky Macho, RPH   30 mg at 09/30/19 0555  . guaiFENesin-dextromethorphan (ROBITUSSIN DM) 100-10 MG/5ML syrup 10 mL  10 mL Oral Q4H PRN Peyton Bottoms, MD      . insulin aspart (novoLOG) injection 0-15 Units  0-15 Units Subcutaneous TID WC Barb Merino, MD   20 Units at 09/30/19 1246  . insulin aspart (novoLOG)  injection 0-5 Units  0-5 Units Subcutaneous QHS Barb Merino, MD   4 Units at 09/29/19 2117  . insulin aspart (novoLOG) injection 6 Units  6 Units Subcutaneous TID WC Ghimire, Kuber, MD      . insulin detemir (LEVEMIR) injection 8 Units  8 Units Subcutaneous BID Barb Merino, MD      . Ipratropium-Albuterol (COMBIVENT) respimat 1 puff  1 puff Inhalation Q6H Peyton Bottoms, MD   1 puff at 09/30/19 0948  . isosorbide mononitrate (IMDUR) 24 hr tablet 30 mg  30 mg Oral TID Peyton Bottoms, MD   30 mg at 09/29/19 2117  . lidocaine (LIDODERM) 5 % 1 patch  1 patch Transdermal Q24H Barb Merino, MD   1 patch at 09/29/19 1755  . morphine 2 MG/ML injection 2 mg  2 mg Intravenous Q4H PRN Peyton Bottoms, MD      . remdesivir 100 mg in sodium chloride 0.9 % 100 mL IVPB  100 mg Intravenous Daily Franky Macho, RPH 200 mL/hr at 09/29/19 1016 100 mg at 09/29/19 1016  . zinc sulfate capsule 220 mg  220 mg Oral Daily Peyton Bottoms, MD   220 mg at 09/29/19 1011     Discharge Medications: Please see discharge summary for a list of discharge medications.  Relevant Imaging Results:  Relevant Lab Results:   Additional Information SSN: [place], [place]

## 2019-09-30 NOTE — Progress Notes (Signed)
Inpatient Diabetes Program Recommendations  AACE/ADA: New Consensus Statement on Inpatient Glycemic Control (2015)  Target Ranges:  Prepandial:   less than 140 mg/dL      Peak postprandial:   less than 180 mg/dL (1-2 hours)      Critically ill patients:  140 - 180 mg/dL   Lab Results  Component Value Date   GLUCAP 433 (H) 09/30/2019   HGBA1C 6.0 (H) 09/28/2019    Review of Glycemic Control Results for Regina Herrera, Regina Herrera (MRN [id number]) as of 09/30/2019 11:19  Ref. Range 09/29/2019 08:36 09/29/2019 11:45 09/29/2019 16:50 09/29/2019 21:15 09/30/2019 07:53  Glucose-Capillary Latest Ref Range: 70 - 99 mg/dL 280 (H) 261 (H) 75 303 (H) 433 (H)   Diabetes history: DM 2 Outpatient Diabetes medications: Amaryl 1 mg daily, Levemir 10 units daily Current orders for Inpatient glycemic control:  Decadron 6 mg daily Novolog resistant tid with meals and HS Levemir 10 units q HS Inpatient Diabetes Program Recommendations:   May consider increasing Levemir to 8 units bid.  Consider reducing Novolog correction to moderate tid with meals and HS.   Thanks  Adah Perl, RN, BC-ADM Inpatient Diabetes Coordinator Pager [phone] (8a-5p)

## 2019-09-30 NOTE — TOC Initial Note (Signed)
Transition of Care [facility]) - Initial/Assessment Note    Patient Details  Name: Regina Herrera MRN: [id number] Date of Birth: 10/25/1926  Transition of Care [facility]) CM/SW Contact:    Shade Flood, LCSW Phone Number: 09/30/2019, 2:12 PM  Clinical Narrative:                  Pt admitted from home. She has an apartment in [place] and was independent in ADL's prior to admission. PT recommending SNF rehab at dc. Per MD, pt with some hospital delirium. Spoke with pt's granddaughter by phone to discuss dc needs.  Gdtr in agreement with SNF rehab. Discussed options for pt's with COVID diagnosis and gdtr expresses understanding. Will refer out and follow up with family once options are known.  Pt will need insurance authorization for SNF and for transport at dc.  TOC will follow.  Expected Discharge Plan: Skilled Nursing Facility Barriers to Discharge: Continued Medical Work up   Patient Goals and CMS Choice   CMS Medicare.gov Compare Post Acute Care list provided to:: Patient Represenative (must comment) Choice offered to / list presented to : Adult Children  Expected Discharge Plan and Services Expected Discharge Plan: [place] In-house Referral: Clinical Social Work   Post Acute Care Choice: [place] Living arrangements for the past 2 months: Apartment                                      Prior Living Arrangements/Services Living arrangements for the past 2 months: Apartment Lives with:: Self Patient language and need for interpreter reviewed:: Yes Do you feel safe going back to the place where you live?: Yes      Need for Family Participation in Patient Care: Yes (Comment) Care giver support system in place?: No (comment)   Criminal Activity/Legal Involvement Pertinent to Current Situation/Hospitalization: No - Comment as needed  Activities of Daily Living Home Assistive Devices/Equipment: Built-in shower seat, Eyeglasses,  Grab bars around toilet, Grab bars in shower, Walker (specify type) ADL Screening (condition at time of admission) Patient's cognitive ability adequate to safely complete daily activities?: Yes Is the patient deaf or have difficulty hearing?: Yes Does the patient have difficulty seeing, even when wearing glasses/contacts?: No Does the patient have difficulty concentrating, remembering, or making decisions?: No Patient able to express need for assistance with ADLs?: Yes Does the patient have difficulty dressing or bathing?: No Independently performs ADLs?: Yes (appropriate for developmental age) Does the patient have difficulty walking or climbing stairs?: Yes Weakness of Legs: Both Weakness of Arms/Hands: Both  Permission Sought/Granted Permission sought to share information with : Chartered certified accountant granted to share information with : Yes, Verbal Permission Granted     Permission granted to share info w AGENCY: SNFs        Emotional Assessment       Orientation: : Oriented to Self Alcohol / Substance Use: Not Applicable Psych Involvement: No (comment)  Admission diagnosis:  Acute hypoxemic respiratory failure due to severe acute respiratory syndrome coronavirus 2 (SARS-CoV-2) disease ([place]) [U07.1, J96.01] Patient Active Problem List   Diagnosis Date Noted  . Acute hypoxemic respiratory failure due to severe acute respiratory syndrome coronavirus 2 (SARS-CoV-2) disease ([place]) 09/28/2019  . Chronic diastolic heart failure ([place]) 12/20/2017  . Hx of CABG 05/09/2017  . CKD (chronic kidney disease) 05/08/2017  . DM (diabetes mellitus) ([place]) 05/08/2017  . PAF (  paroxysmal atrial fibrillation) ([place]) 05/08/2017  . Sinus bradycardia 05/08/2017  . Syncope 05/08/2017  . Anemia 05/08/2017  . Iron deficiency anemia 06/10/2015  . LVH (left ventricular hypertrophy) 06/10/2015  . Coronary artery disease involving native coronary artery of native heart 06/09/2015  .  Hypertensive heart disease with heart failure ([place]) 06/09/2015  . Hyperlipidemia 06/09/2015   PCP:  Marco Collie, MD Pharmacy:   [facility] [place], [address] AT [address] [place] [address] Phone: [phone] Fax: [phone]     Social Determinants of Health (SDOH) Interventions    Readmission Risk Interventions Readmission Risk Prevention Plan 09/30/2019  Transportation Screening Complete  Medication Review (RN CM) Complete  Some recent data might be hidden

## 2019-09-30 NOTE — Progress Notes (Signed)
PROGRESS NOTE    Regina Herrera  [id number] DOB: 1927/03/22 DOA: 09/28/2019 PCP: Marco Collie, MD    Brief Narrative:  86 year old female with history of IDDM, paroxysmal A. fib, hypertension, hyperlipidemia, diastolic dysfunction presented to [facility] emergency room with worsening shortness of breath, generalized weakness and myalgias.  In the emergency room she was found with A. fib with RVR.  She has significant history with recent multiple transfusions, GI bleed.  She had been in poor health since last 6 months.  In the emergency room, she was found with saturation of 70% and transported to [facility] on [address].  Initially on Cardizem drip, off since then. CXR new bilateral pulmonary infiltrates consistent with patient's COVID-19 positive status 09/28/2019: Remains on about 5 L of oxygen, occasionally on high level of oxygen at night.  Continues to complain of hip pain, negative x-ray. 09/29/2019: Developing some delirium and confusion.  Assessment & Plan:   Principal Problem:   Acute hypoxemic respiratory failure due to severe acute respiratory syndrome coronavirus 2 (SARS-CoV-2) disease (HCC) Active Problems:   Coronary artery disease involving native coronary artery of native heart   Hypertensive heart disease with heart failure (HCC)   Hyperlipidemia   CKD (chronic kidney disease)   PAF (paroxysmal atrial fibrillation) (HCC)   Chronic diastolic heart failure (HCC)  Pneumonia due to COVID-19 virus, acute hypoxemic respiratory failure due to COVID-19: Continue to monitor due to significant symptoms, still on significant oxygen. chest physiotherapy, incentive spirometry, deep breathing exercises, sputum induction, mucolytic's and bronchodilators. Supplemental oxygen to keep saturations more than 90%. Covid directed therapy with , steroids, dexamethasone remdesivir, day 4 5 antibiotics, started on cefepime due to multiple recent hospital visits.  Will  continue, checking urinalysis. Due to severity of symptoms, patient will need daily inflammatory markers, chest x-rays, liver function test to monitor and direct COVID-19 therapies.  Hospital-acquired delirium: Patient is gradually developing delirium.  No focal deficit. Head CT negative.  Hemoglobin low but is stable.  Sent for urine culture, however already on cefepime. This is probably due to combination of viral infection, steroids, isolation. All-time fall precautions. Will discontinue all sedatives, will discontinue all narcotic medications and monitor.  Diabetes type 2: On insulin at home.  Continue insulin and cover with sliding scale.  Anticipate worsening with use of steroids.  Chronic diastolic heart failure: Fairly compensated.  On torsemide.  Discontinue today with worsening creatinine.  She is euvolemic on exam.  Coronary artery disease: Currently denies any chest pain.  Continue nitrates.  Unable to tolerate aspirin.  AKI CKD stage III: With hyperkalemia.  Renal functions fluctuate.  Continue to monitor.  Right hip/ thigh pain: No history of trauma.  Hip x-ray with no evidence of fracture or dislocation.  Mobilize.  Paroxysmal A. fib with RVR: Treated with Cardizem drip in the emergency room.  Now rate controlled in sinus rhythm.  She is not on any rate control medicine at home.  Will start on Cardizem if any recurrence. Used to be on aspirin, had recurrent severe GI bleed and taken off all anticoagulation.  DVT prophylaxis: Lovenox subcu Code Status: DNR Family Communication: Patient's granddaughter was called and updated about the plan of care including delirium that she developed today. Disposition Plan: Home after adequate improvement.   Consultants:   None  Procedures:   None  Antimicrobials:  Anti-infectives (From admission, onward)   Start     Dose/Rate Route Frequency Ordered Stop   09/29/19 1000  ceFEPIme (MAXIPIME) 2  g in sodium chloride 0.9 % 100 mL  IVPB     2 g 200 mL/hr over 30 Minutes Intravenous Every 24 hours 09/29/19 0006     09/29/19 0015  ceFEPIme (MAXIPIME) 1 g in sodium chloride 0.9 % 100 mL IVPB  Status:  Discontinued     1 g 200 mL/hr over 30 Minutes Intravenous NOW 09/29/19 0006 09/29/19 0128   09/28/19 2200  ceFEPIme (MAXIPIME) 1 g in sodium chloride 0.9 % 100 mL IVPB  Status:  Discontinued     1 g 200 mL/hr over 30 Minutes Intravenous Every 12 hours 09/28/19 1411 09/29/19 0006   09/28/19 1000  remdesivir 100 mg in sodium chloride 0.9 % 100 mL IVPB     100 mg 200 mL/hr over 30 Minutes Intravenous Daily 09/28/19 0201 10/02/19 0959   09/28/19 1000  ceFEPIme (MAXIPIME) 500 mg in dextrose 5 % 50 mL IVPB  Status:  Discontinued     500 mg 100 mL/hr over 30 Minutes Intravenous Every 12 hours 09/28/19 0739 09/28/19 1411         Subjective: Patient seen and examined.  Her oxygen requirement is about 4 to 10 L of oxygen.  Since yesterday, patient has been more delirious, less interactive and at times confused.  She is unable to see and interact well.  No focal deficits. Objective: Vitals:   09/29/19 2300 09/30/19 0240 09/30/19 0525 09/30/19 0754  BP:    (!) 128/52  Pulse: (!) 102 80 76 74  Resp:    18  Temp:    (!) 97.1 F (36.2 C)  TempSrc:    Axillary  SpO2: 90% 95% 92% 92%  Weight:      Height:        Intake/Output Summary (Last 24 hours) at 09/30/2019 1157 Last data filed at 09/30/2019 [id number] Gross per 24 hour  Intake 900 ml  Output 300 ml  Net 600 ml   Filed Weights   09/28/19 0000  Weight: 58.8 kg    Examination:  General exam: Patient was on 4 L oxygen.  Not in any respiratory distress.  Looks anxious.  Unable to explain symptoms. Respiratory system: Clear to auscultation. Respiratory effort normal.  No added sounds. Cardiovascular system: S1 & S2 heard, RRR. No JVD, murmurs, rubs, gallops or clicks. No pedal edema. Gastrointestinal system: Abdomen is nondistended, soft and nontender. No organomegaly  or masses felt. Normal bowel sounds heard. Central nervous system: Alert and oriented x2.  Easily distractible.   Extremities: Symmetric 5 x 5 power.  No focal deficits. Skin: No rashes, lesions or ulcers Psychiatry: Judgement and insight appear compromised.  Mood & affect anxious. Right hip, no deformity, right leg with no rotational deformity.  No localized tenderness.   Data Reviewed: I have personally reviewed following labs and imaging studies  CBC: Recent Labs  Lab 09/28/19 0409 09/29/19 0531 09/30/19 0338  WBC 4.6 5.6 7.4  NEUTROABS 4.0 5.0 6.4  HGB 8.8* 8.4* 8.4*  HCT 28.8* 27.7* 28.0*  MCV 93.2 94.9 94.3  PLT 315 394 [id number]*   Basic Metabolic Panel: Recent Labs  Lab 09/28/19 0409 09/29/19 0531 09/30/19 0338  NA 131* 133* 130*  K 4.8 5.3* 4.6  CL 103 102 99  CO2 17* 17* 19*  GLUCOSE 293* 263* 472*  BUN 34* 42* 55*  CREATININE 1.11* 1.26* 1.49*  CALCIUM 8.2* 7.8* 8.8*   GFR: Estimated Creatinine Clearance: 19.8 mL/min (A) (by C-G formula based on SCr of 1.49 mg/dL (H)). Liver Function  Tests: Recent Labs  Lab 09/28/19 0409 09/29/19 0531 09/30/19 0338  AST 22 23 30   ALT 12 11 13   ALKPHOS 60 60 62  BILITOT 0.4 0.8 0.1*  PROT 6.7 6.2* 6.1*  ALBUMIN 2.5* 2.4* 2.4*   No results for input(s): LIPASE, AMYLASE in the last 168 hours. No results for input(s): AMMONIA in the last 168 hours. Coagulation Profile: No results for input(s): INR, PROTIME in the last 168 hours. Cardiac Enzymes: No results for input(s): CKTOTAL, CKMB, CKMBINDEX, TROPONINI in the last 168 hours. BNP (last 3 results) Recent Labs    05/14/19 1420  PROBNP 1,007*   HbA1C: Recent Labs    09/28/19 0409  HGBA1C 6.0*   CBG: Recent Labs  Lab 09/29/19 1145 09/29/19 1650 09/29/19 2115 09/30/19 0753 09/30/19 1132  GLUCAP 261* 75 303* 433* 443*   Lipid Profile: No results for input(s): CHOL, HDL, LDLCALC, TRIG, CHOLHDL, LDLDIRECT in the last 72 hours. Thyroid Function Tests: No  results for input(s): TSH, T4TOTAL, FREET4, T3FREE, THYROIDAB in the last 72 hours. Anemia Panel: Recent Labs    09/29/19 0531 09/30/19 0338  FERRITIN 909* 731*   Sepsis Labs: No results for input(s): PROCALCITON, LATICACIDVEN in the last 168 hours.  No results found for this or any previous visit (from the past 240 hour(s)).       Radiology Studies: CT HEAD WO CONTRAST  Result Date: 09/30/2019 CLINICAL DATA:  Altered mental status. EXAM: CT HEAD WITHOUT CONTRAST TECHNIQUE: Contiguous axial images were obtained from the base of the skull through the vertex without intravenous contrast. COMPARISON:  Head CT scan 04/16/2019. FINDINGS: Brain: No evidence of acute infarction, hemorrhage, hydrocephalus, extra-axial collection or mass lesion/mass effect. Mild chronic microvascular ischemic change noted. Vascular: No hyperdense vessel or unexpected calcification. Skull: Intact.  No fracture or focal lesion. Sinuses/Orbits: Negative. Other: None. IMPRESSION: No acute abnormality. Mild chronic microvascular ischemic change. Electronically Signed   By: Inge Rise M.D.   On: 09/30/2019 11:14   DG CHEST PORT 1 VIEW  Result Date: 09/30/2019 CLINICAL DATA:  Follow-up COVID pneumonia. EXAM: PORTABLE CHEST 1 VIEW COMPARISON:  09/27/2019 FINDINGS: The cardiac silhouette, mediastinal and hilar contours are stable. Stable surgical changes from triple bypass surgery. Persistent bilateral infiltrates but slight improved right basilar aeration. IMPRESSION: Persistent patchy bilateral infiltrates with slight improved right basilar aeration. Electronically Signed   By: Marijo Sanes M.D.   On: 09/30/2019 08:01   DG HIPS BILAT WITH PELVIS 2V  Result Date: 09/28/2019 CLINICAL DATA:  Right hip pain.  No reported injury. EXAM: DG HIP (WITH OR WITHOUT PELVIS) 2V BILAT COMPARISON:  None. FINDINGS: No fracture. No right hip dislocation. No suspicious focal osseous lesions. Mild right hip osteoarthritis. Scattered  surgical clips overlying the right iliac wing and right groin. IMPRESSION: No acute osseous abnormality. No right hip dislocation. Mild right hip osteoarthritis. Electronically Signed   By: Ilona Sorrel M.D.   On: 09/28/2019 17:07        Scheduled Meds: . amLODipine  10 mg Oral Daily  . vitamin C  500 mg Oral Daily  . dexamethasone  6 mg Oral Q24H  . docusate sodium  100 mg Oral Daily  . enoxaparin (LOVENOX) injection  30 mg Subcutaneous Q24H  . insulin aspart  0-15 Units Subcutaneous TID WC  . insulin aspart  0-5 Units Subcutaneous QHS  . insulin detemir  8 Units Subcutaneous BID  . Ipratropium-Albuterol  1 puff Inhalation Q6H  . isosorbide mononitrate  30 mg Oral  TID  . lidocaine  1 patch Transdermal Q24H  . zinc sulfate  220 mg Oral Daily   Continuous Infusions: . ceFEPime (MAXIPIME) IV 2 g (09/29/19 1014)  . remdesivir 100 mg in NS 100 mL 100 mg (09/29/19 1016)     LOS: 2 days    Time spent: 30 minutes    Barb Merino, MD [facility] Pager [phone]

## 2019-10-01 DIAGNOSIS — J9601 Acute respiratory failure with hypoxia: Secondary | ICD-10-CM

## 2019-10-01 DIAGNOSIS — U071 COVID-19: Principal | ICD-10-CM

## 2019-10-01 LAB — COMPREHENSIVE METABOLIC PANEL WITH GFR
ALT: 16 U/L (ref 0–44)
AST: 32 U/L (ref 15–41)
Albumin: 2.7 g/dL — ABNORMAL LOW (ref 3.5–5.0)
Alkaline Phosphatase: 67 U/L (ref 38–126)
Anion gap: 13 (ref 5–15)
BUN: 59 mg/dL — ABNORMAL HIGH (ref 8–23)
CO2: 20 mmol/L — ABNORMAL LOW (ref 22–32)
Calcium: 9.6 mg/dL (ref 8.9–10.3)
Chloride: 107 mmol/L (ref 98–111)
Creatinine, Ser: 1.33 mg/dL — ABNORMAL HIGH (ref 0.44–1.00)
GFR calc Af Amer: 41 mL/min — ABNORMAL LOW
GFR calc non Af Amer: 35 mL/min — ABNORMAL LOW
Glucose, Bld: 129 mg/dL — ABNORMAL HIGH (ref 70–99)
Potassium: 3.9 mmol/L (ref 3.5–5.1)
Sodium: 140 mmol/L (ref 135–145)
Total Bilirubin: 0.5 mg/dL (ref 0.3–1.2)
Total Protein: 6.8 g/dL (ref 6.5–8.1)

## 2019-10-01 LAB — CBC WITH DIFFERENTIAL/PLATELET
Abs Immature Granulocytes: 0.35 10*3/uL — ABNORMAL HIGH (ref 0.00–0.07)
Basophils Absolute: 0.1 10*3/uL (ref 0.0–0.1)
Basophils Relative: 1 %
Eosinophils Absolute: 0.2 10*3/uL (ref 0.0–0.5)
Eosinophils Relative: 3 %
HCT: 31 % — ABNORMAL LOW (ref 36.0–46.0)
Hemoglobin: 9.7 g/dL — ABNORMAL LOW (ref 12.0–15.0)
Immature Granulocytes: 4 %
Lymphocytes Relative: 8 %
Lymphs Abs: 0.7 10*3/uL (ref 0.7–4.0)
MCH: 28.8 pg (ref 26.0–34.0)
MCHC: 31.3 g/dL (ref 30.0–36.0)
MCV: 92 fL (ref 80.0–100.0)
Monocytes Absolute: 0.8 10*3/uL (ref 0.1–1.0)
Monocytes Relative: 8 %
Neutro Abs: 7.5 10*3/uL (ref 1.7–7.7)
Neutrophils Relative %: 76 %
Platelets: 657 10*3/uL — ABNORMAL HIGH (ref 150–400)
RBC: 3.37 MIL/uL — ABNORMAL LOW (ref 3.87–5.11)
RDW: 17.1 % — ABNORMAL HIGH (ref 11.5–15.5)
WBC: 9.7 10*3/uL (ref 4.0–10.5)
nRBC: 1.9 % — ABNORMAL HIGH (ref 0.0–0.2)

## 2019-10-01 LAB — GLUCOSE, CAPILLARY
Glucose-Capillary: 128 mg/dL — ABNORMAL HIGH (ref 70–99)
Glucose-Capillary: 209 mg/dL — ABNORMAL HIGH (ref 70–99)
Glucose-Capillary: 332 mg/dL — ABNORMAL HIGH (ref 70–99)
Glucose-Capillary: 321 mg/dL — ABNORMAL HIGH (ref 70–99)

## 2019-10-01 LAB — FERRITIN: Ferritin: 720 ng/mL — ABNORMAL HIGH (ref 11–307)

## 2019-10-01 LAB — D-DIMER, QUANTITATIVE: D-Dimer, Quant: 2.34 ug/mL-FEU — ABNORMAL HIGH (ref 0.00–0.50)

## 2019-10-01 LAB — C-REACTIVE PROTEIN: CRP: 3 mg/dL — ABNORMAL HIGH

## 2019-10-01 MED ORDER — LIDOCAINE 5 % EX PTCH: 1.0000 | MEDICATED_PATCH | CUTANEOUS | 0 refills | Status: AC

## 2019-10-01 MED ORDER — HALOPERIDOL LACTATE 5 MG/ML IJ SOLN: 2.0000 mg | Freq: Four times a day (QID) | INTRAMUSCULAR | Status: AC | PRN

## 2019-10-01 MED ORDER — HYDROCOD POLST-CPM POLST ER 10-8 MG/5ML PO SUER: 5.0000 mL | Freq: Two times a day (BID) | ORAL | Status: AC | PRN

## 2019-10-01 MED ORDER — LORAZEPAM 2 MG/ML IJ SOLN: 0.5000 mg | INTRAMUSCULAR | 0 refills | Status: AC | PRN

## 2019-10-01 MED ORDER — ACETAMINOPHEN 325 MG PO TABS: 650.0000 mg | ORAL_TABLET | Freq: Four times a day (QID) | ORAL | Status: AC | PRN

## 2019-10-01 MED ORDER — GUAIFENESIN-DM 100-10 MG/5ML PO SYRP: 10.0000 mL | ORAL_SOLUTION | ORAL | 0 refills | Status: AC | PRN

## 2019-10-01 MED ORDER — DOCUSATE SODIUM 100 MG PO CAPS: 100.0000 mg | ORAL_CAPSULE | Freq: Every day | ORAL | 0 refills | Status: AC

## 2019-10-01 NOTE — Progress Notes (Signed)
[facility] [place]:  [place] to pt's grand daughter Hal Hope and confirmed interest in [facility]. She agrees with comfort care measures and confirms DNR.  Our MD has approved pt for GIP level of care and she can transfer today if MD at hospital in agreement.   Spoke to the [employer] and she states MD is in agreement. She will set up ambulance for pt as soon as possible. Webb Silversmith RN [phone]

## 2019-10-01 NOTE — Discharge Instructions (Signed)
Hospice Hospice is a service that is designed to provide people who are terminally ill and their families with medical, spiritual, and psychological support. Its aim is to improve your quality of life by keeping you as comfortable as possible in the final stages of life. Who will be my providers when I begin hospice care? Hospice teams often include:  A nurse.  A doctor. The hospice doctor will be available for your care, but you can include your regular doctor or nurse practitioner.  A social worker.  A counselor.  A religious leader (such as a chaplain).  A dietitian.  Therapists.  Trained volunteers who can help with care. What services does hospice provide? Hospice services can vary depending on the center or organization. Generally, they include:  Ways to keep you comfortable, such as: ? Providing care in your home or in a home-like setting. ? Working with your family and friends to help meet your needs. ? Allowing you to enjoy the support of loved ones by receiving much of your basic care from family and friends.  Pain relief and symptom management. The staff will supply all necessary medicines and equipment so that you can stay comfortable and alert enough to enjoy the company of your friends and family.  Visits or care from a nurse and doctor. This may include 24-hour on-call services.  Companionship when you are alone.  Allowing you and your family to rest. Hospice staff may do light housekeeping, prepare meals, and run errands.  Counseling. They will make sure your emotional, spiritual, and social needs are being met, as well as those needs of your family members.  Spiritual care. This will be individualized to meet your needs and your family's needs. It may involve: ? Helping you and your family understand the dying process. ? Helping you say goodbye to your family and friends. ? Performing a specific religious ceremony or ritual.  Massage.  Nutrition  therapy.  Physical and occupational therapy.  Short-term inpatient care, if something cannot be managed in the home.  Art or music therapy.  Bereavement support for grieving family members. When should hospice care begin? Most people who use hospice are believed to have less than 6 months to live.  Your family and health care providers can help you decide when hospice services should begin.  If you live longer than 6 months but your condition does not improve, your doctor may be able to approve you for continued hospice care.  If your condition improves, you may discontinue the program. What should I consider before selecting a program? Most hospice programs are run by nonprofit, independent organizations. Some are affiliated with hospitals, nursing homes, or home health care agencies. Hospice programs can take place in your home or at a hospice center, hospital, or skilled nursing facility. When choosing a hospice program, ask the following questions:  What services are available to me?  What services will be offered to my loved ones?  How involved will my loved ones be?  How involved will my health care provider be?  Who makes up the hospice care team? How are they trained or screened?  How will my pain and symptoms be managed?  If my circumstances change, can the services be provided in a different setting, such as my home or in the hospital?  Is the program reviewed and licensed by the state or certified in some other way?  What does it cost? Is it covered by insurance?  If I choose a hospice   center or nursing home, where is the hospice center located? Is it convenient for family and friends?  If I choose a hospice center or nursing home, can my family and friends visit any time?  Will you provide emotional and spiritual support?  Who can my family call with questions? Where can I learn more about hospice? You can learn about existing hospice programs in your area  from your health care providers. You can also read more about hospice online. The websites of the following organizations have helpful information:  [facility] and Palliative Care Organization [facility]): http://www.brown-buchanan.com/  National Association for [facility]): http://massey-hart.com/  Hospice Foundation of America ([place]): www.hospicefoundation.org  American Cancer Society (ACS): www.cancer.org  Hospice Net: www.hospicenet.org  Visiting Nurse Associations of [place] (VNAA): www.vnaa.org You may also find more information by contacting the following agencies:  A local agency on aging.  Your local [employer] chapter.  Your state's department of health or social services. Summary  Hospice is a service that is designed to provide people who are terminally ill and their families with medical, spiritual, and psychological support.  Hospice aims to improve your quality of life by keeping you as comfortable as possible in the final stages of life.  Hospice teams often include a doctor, nurse, social worker, counselor, religious leader,dietitian, therapists, and volunteers.  Hospice care generally includes medicine for symptom management, visits from doctors and nurses, physical and occupational therapy, nutrition counseling, spiritual and emotional counseling, caregiver support, and bereavement support for grieving family members.  Hospice programs can take place in your home or at a hospice center, hospital, or skilled nursing facility. This information is not intended to replace advice given to you by your health care provider. Make sure you discuss any questions you have with your health care provider. Document Revised: 05/28/2019 Document Reviewed: 09/26/2016 Elsevier Patient Education  [place].

## 2019-10-01 NOTE — Discharge Summary (Signed)
DISCHARGE SUMMARY  Regina Herrera  MR#: [id number]  DOB:[birth year]  Date of Admission: 09/28/2019 Date of Discharge: 10/01/2019  Attending Physician:Kenderick Kobler Hennie Duos, MD  Patient's MR:[id number], Regina Maize, MD  Consults: none   Disposition: D/C to residential hospice facility for comfort focused care   Date of Positive COVID Test: 09/24/2019  Date Quarantine Ends: 10/15/2019  COVID-19 specific Treatment: Remdesivir 1/9 > 1/13 Decadron 1/9 > 1/13  Discharge Diagnoses: COVID Pneumonia Acute hypoxic respiratory failure Hospital-acquired delirium Acute kidney injury on CKD stage III Paroxysmal atrial fibrillation with acute RVR DM 2 Chronic diastolic CHF CAD DNR - NO CODE BLUE   Initial presentation: 86yo with a history of DM, paroxysmal A. fib, HTN, HLD, recent GI bleed, and diastolic CHF who presented to the [facility] ED with progressive shortness of breath, generalized weakness, and myalgias.  She was found to be in RVR and hypoxic with saturations of 70%.  A CXR noted bilateral pulmonary infiltrates.  After being confirmed Covid positive she was transferred to [facility] on a Cardizem drip.  Hospital Course: The patient was admitted to the acute unit at [facility] on transfer from [facility] ED.  She received aggressive medical care for her COVID Pneumonia with decadron, remdesivir, oxygen support, and lung recruitment therapies.  Her atrial fibrillation with RVR was corrected using a Cardizem drip and she was able to be titrated off of this medication.  Unfortunately her hospital stay was complicated by significant acute delirium.  This proved refractory to correction.  It was felt to likely represent a combination of Covid induced encephalopathy, medication effect, and sundowning.  CT head was accomplished and was unrevealing.  Despite ongoing medical care the patient's clinical condition consistently worsened, due in large part to her delirium and inability to  participate in her own care.  The patient's family was very appropriate in their desire to focus on quality of life and to avoid suffering and the decision was ultimately made on 1/13 to transition to a comfort focused approach.  A bed was rapidly able to be identified in a hospice residential facility and the patient was then cleared for discharge.  Allergies as of 10/01/2019      Reactions   Ciprofloxacin    GI upset   Lipitor [atorvastatin Calcium]    Muscle cramps   Codeine Nausea And Vomiting      Medication List    STOP taking these medications   amLODipine 10 MG tablet Commonly known as: NORVASC   aspirin 81 MG chewable tablet   cetirizine 10 MG tablet Commonly known as: ZYRTEC   cholecalciferol 25 MCG (1000 UNIT) tablet Commonly known as: VITAMIN D3   Cranberry 500 MG Caps   ezetimibe 10 MG tablet Commonly known as: ZETIA   Ferrocite 324 (106 Fe) MG Tabs tablet Generic drug: Ferrous Fumarate   ferrous sulfate 325 (65 FE) MG tablet   Fish Oil 1000 MG Caps   gemfibrozil 600 MG tablet Commonly known as: LOPID   glimepiride 1 MG tablet Commonly known as: AMARYL   isosorbide mononitrate 30 MG 24 hr tablet Commonly known as: IMDUR   Levemir FlexTouch 100 UNIT/ML Pen Generic drug: Insulin Detemir   pantoprazole 40 MG tablet Commonly known as: PROTONIX   pravastatin 10 MG tablet Commonly known as: PRAVACHOL   ProAir HFA 108 (90 Base) MCG/ACT inhaler Generic drug: albuterol   Prolia 60 MG/ML Soln injection Generic drug: denosumab   torsemide 20 MG tablet Commonly known as: DEMADEX  Vitamin C 500 MG Caps     TAKE these medications   acetaminophen 325 MG tablet Commonly known as: TYLENOL Take 2 tablets (650 mg total) by mouth every 6 (six) hours as needed for mild pain or headache (fever >/= 101).   chlorpheniramine-HYDROcodone 10-8 MG/5ML Suer Commonly known as: TUSSIONEX Take 5 mLs by mouth every 12 (twelve) hours as needed for cough.     docusate sodium 100 MG capsule Commonly known as: COLACE Take 1 capsule (100 mg total) by mouth daily. Start taking on: October 02, 2019   guaiFENesin-dextromethorphan 100-10 MG/5ML syrup Commonly known as: ROBITUSSIN DM Take 10 mLs by mouth every 4 (four) hours as needed for cough.   haloperidol lactate 5 MG/ML injection Commonly known as: HALDOL Inject 0.4 mLs (2 mg total) into the vein every 6 (six) hours as needed.   lidocaine 5 % Commonly known as: LIDODERM Place 1 patch onto the skin daily. Remove & Discard patch within 12 hours or as directed by MD   LORazepam 2 MG/ML injection Commonly known as: ATIVAN Inject 0.25 mLs (0.5 mg total) into the vein every 4 (four) hours as needed for anxiety or sedation.   meclizine 12.5 MG tablet Commonly known as: ANTIVERT Take 1 tablet by mouth every 8 (eight) hours as needed for dizziness.   nitroGLYCERIN 0.4 MG SL tablet Commonly known as: NITROSTAT Place 1 tablet (0.4 mg total) under the tongue every 5 (five) minutes as needed for chest pain.       Day of Discharge BP (!) 153/73 (BP Location: Right Arm)   Pulse (!) 29   Temp 98.8 F (37.1 C) (Oral)   Resp 20   Ht 5\' 2"  (1.575 m)   Wt 58.8 kg   SpO2 (!) 89%   BMI 23.72 kg/m   Physical Exam: General: No acute respiratory distress evident - pt delirious  Lungs: fine crackles diffusely - no wheezing  Cardiovascular: Regular rate and rhythm without murmur  Abdomen: NT/ND, soft, bs+, no mass  Extremities: No significant edema bilateral lower extremities  Basic Metabolic Panel: Recent Labs  Lab 09/28/19 0409 09/29/19 0531 09/30/19 0338 10/01/19 0400  NA 131* 133* 130* 140  K 4.8 5.3* 4.6 3.9  CL 103 102 99 107  CO2 17* 17* 19* 20*  GLUCOSE 293* 263* 472* 129*  BUN 34* 42* 55* 59*  CREATININE 1.11* 1.26* 1.49* 1.33*  CALCIUM 8.2* 7.8* 8.8* 9.6    Liver Function Tests: Recent Labs  Lab 09/28/19 0409 09/29/19 0531 09/30/19 0338 10/01/19 0400  AST 22 23 30   32  ALT 12 11 13 16   ALKPHOS 60 60 62 67  BILITOT 0.4 0.8 0.1* 0.5  PROT 6.7 6.2* 6.1* 6.8  ALBUMIN 2.5* 2.4* 2.4* 2.7*    CBC: Recent Labs  Lab 09/28/19 0409 09/29/19 0531 09/30/19 0338 10/01/19 0400  WBC 4.6 5.6 7.4 9.7  NEUTROABS 4.0 5.0 6.4 7.5  HGB 8.8* 8.4* 8.4* 9.7*  HCT 28.8* 27.7* 28.0* 31.0*  MCV 93.2 94.9 94.3 92.0  PLT 315 394 496* 657*    ProBNP (last 3 results) Recent Labs    05/14/19 1420  PROBNP 1,007*     CBG: Recent Labs  Lab 09/30/19 1704 09/30/19 1753 09/30/19 2001 10/01/19 0305 10/01/19 0738  GLUCAP 214* 119* 170* 128* 209*    Time spent in discharge (includes decision making & examination of pt): 30 minutes  10/01/2019, 3:55 PM   Cherene Altes, MD [facility] Office  [phone]

## 2019-10-01 NOTE — Consult Note (Signed)
Palliative care consult received. Chart reviewed and patient is appropriate for referral to hospice IPU level care. This transition is in progress. Maintain current comfort measures and additional management per hospice IPU.  Please call if there are additional needs or the care transition plan changes.  Lane Hacker, DO Palliative Medicine [phone]

## 2019-10-01 NOTE — TOC Transition Note (Signed)
Transition of Care [facility]) - CM/SW Discharge Note   Patient Details  Name: Jalitza Raimondi MRN: [id number] Date of Birth: 11-06-1926  [facility] CM/SW Contact:  Shade Flood, LCSW Phone Number: 10/01/2019, 3:30 PM   Clinical Narrative:     Family electing for hospice care and would like pt to transfer to [facility] residence. Spoke with Cherie at [facility] and she states pt is accepted and they can admit today. Updated MD.  Updated pt's RN. Will call EMS when pt ready.  Phone number for report is [phone].  There are no other TOC needs for dc.  Final next level of care: [place] Barriers to Discharge: Barriers Resolved   Patient Goals and CMS Choice   CMS Medicare.gov Compare Post Acute Care list provided to:: Patient Represenative (must comment) Choice offered to / list presented to : Adult Children  Discharge Placement                       Discharge Plan and Services In-house Referral: Clinical Social Work   Post Acute Care Choice: [place]                               Social Determinants of Health (SDOH) Interventions     Readmission Risk Interventions Readmission Risk Prevention Plan 09/30/2019  Transportation Screening Complete  Medication Review (RN CM) Complete  Some recent data might be hidden

## 2019-10-02 LAB — GLUCOSE, CAPILLARY: Glucose-Capillary: 260 mg/dL — ABNORMAL HIGH (ref 70–99)

## 2019-10-02 NOTE — Plan of Care (Signed)
Dc/Shift note. Pt accepted by this RN at 1900, awaits transport from EMS to [place](hospice) care unit. Report called to facility by previous assigned RN. All paperwork needed for transport at charge desk. Pt off monitor and resting in bed, safety precautions taken with matts on floor, low bed, alarms intact at that time. Pt continues confused, calling out, mumbling (unable to determine whats actually being said), not following commands, but allows this RN to perform tasks w/o resistance (ie: bp cuff placement, incontinent care, T&P). Pt monitored and cared for until 2255 when EMS arrived and transferred pts care to them. Pts VS's taken, all WNL. [place]@2L  applied. Pt securely transported from [facility] by said EMS. Pt unremarkable at time of d/c.

## 2019-10-20 DEATH — deceased

## 2020-10-19 DEATH — deceased

## 2021-09-19 IMAGING — DX DG CHEST 1V PORT
1 series · 1 of 1 positions shown · non-contrast
Comparison: 09/27/2019

CLINICAL DATA: Follow-up COVID pneumonia.

EXAM:
PORTABLE CHEST 1 VIEW

[chest]
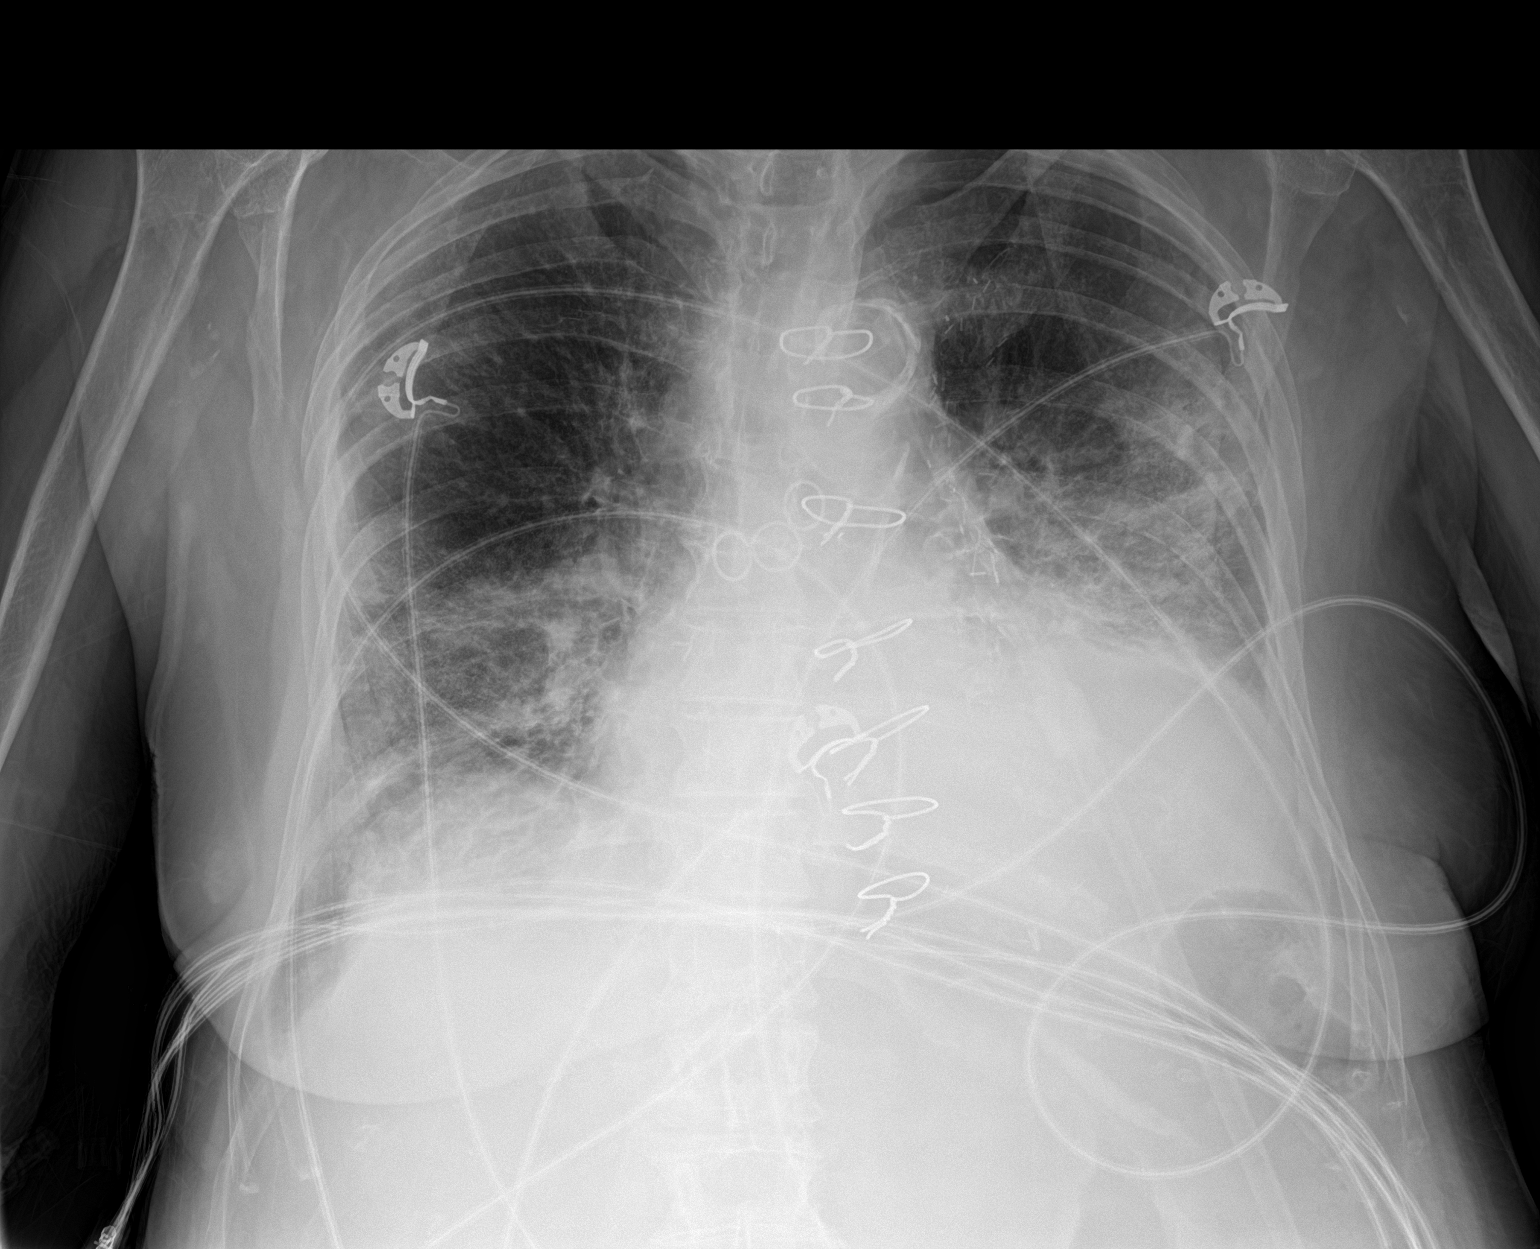

[1 of 1 positions shown; findings below may reference images not displayed]

FINDINGS: The cardiac silhouette, mediastinal and hilar contours are stable.
Stable surgical changes from triple bypass surgery. Persistent
bilateral infiltrates but slight improved right basilar aeration.
IMPRESSION: Persistent patchy bilateral infiltrates with slight improved right
basilar aeration.

## 2021-09-19 IMAGING — CT CT HEAD W/O CM
2 series · 16 of 30 positions shown, 20 images · non-contrast
Comparison: Head CT scan 04/16/2019.

CLINICAL DATA: Altered mental status.

EXAM:
CT HEAD WITHOUT CONTRAST
TECHNIQUE: Contiguous axial images were obtained from the base of the skull
through the vertex without intravenous contrast.

[Series 4: routine head w/o · axial · non-contrast · 0.45mm/px · z∈[-37,+107]mm · 13 of 36 slices shown, 17 images]
[im 3/36  brain]
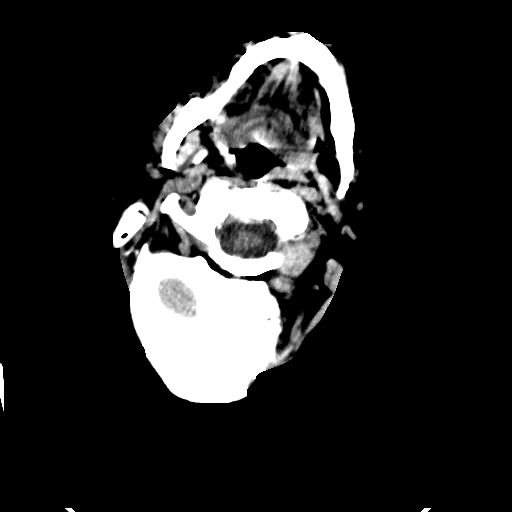
[im 3/36  bone]
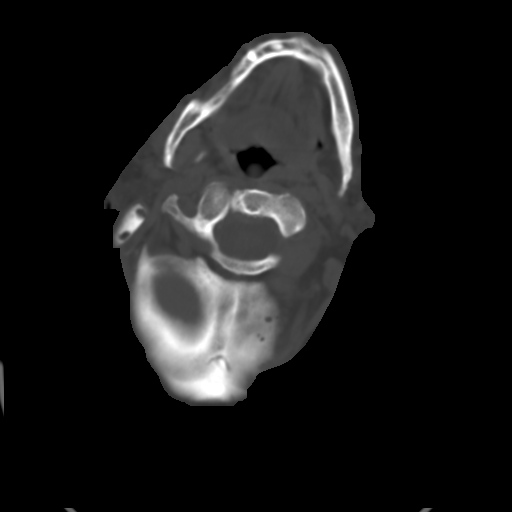
[im 6/36  brain]
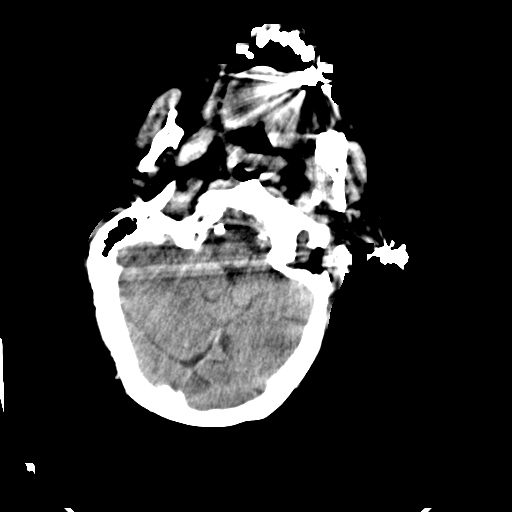
[im 8/36  brain]
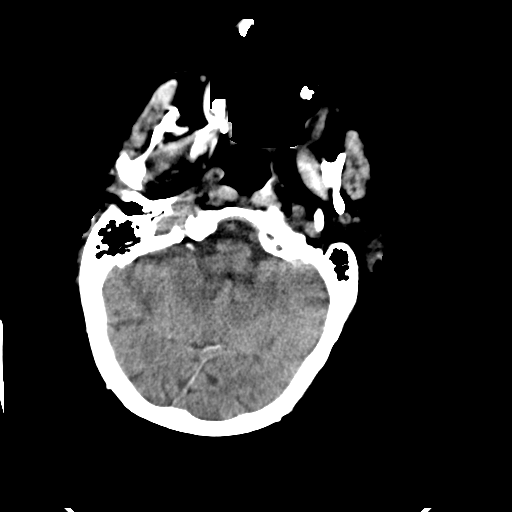
[im 11/36  brain]
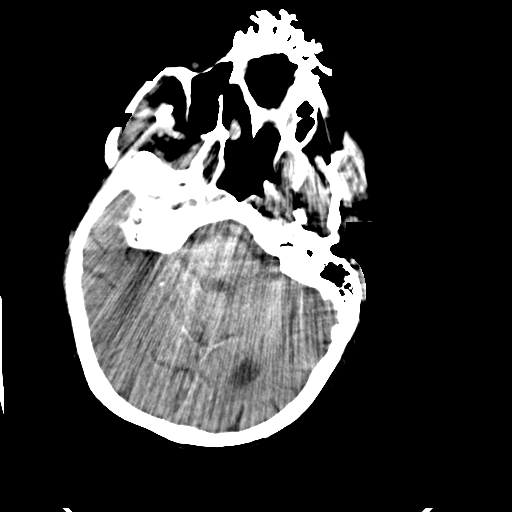
[im 13/36  brain]
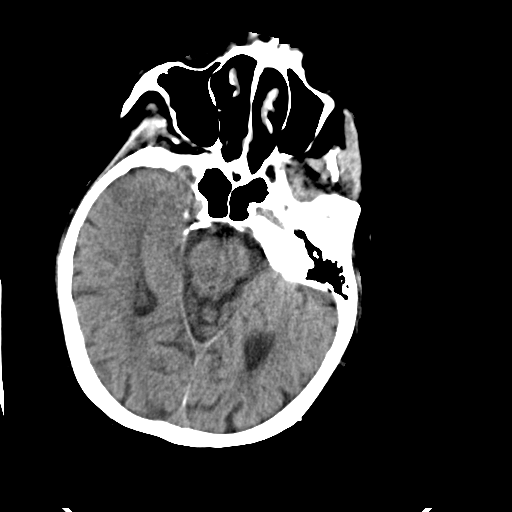
[im 13/36  bone]
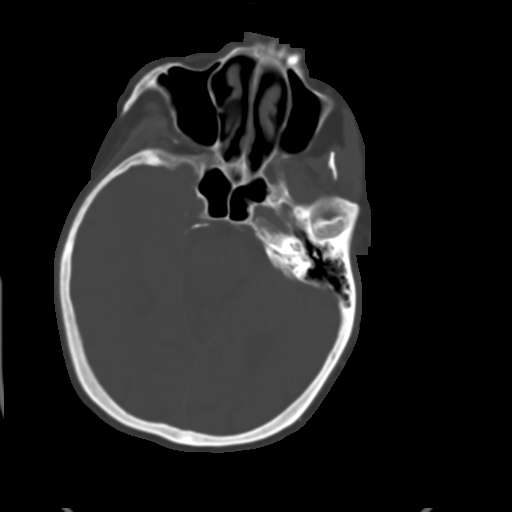
[im 16/36  brain]
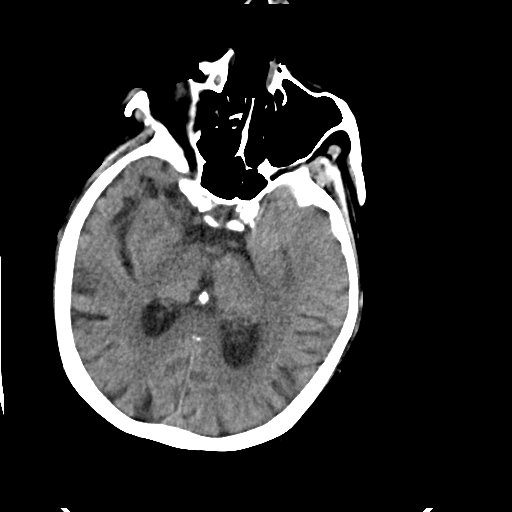
[im 18/36  brain]
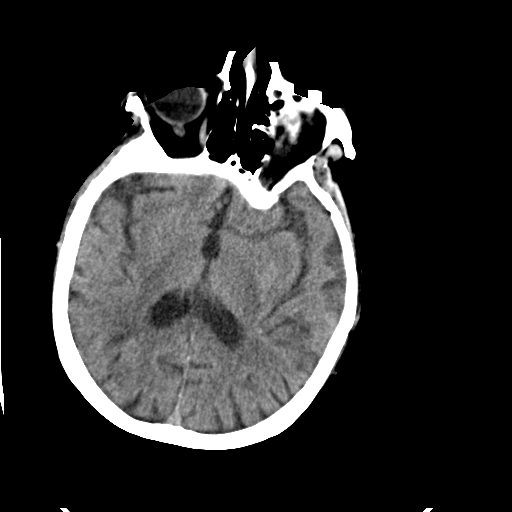
[im 21/36  brain]
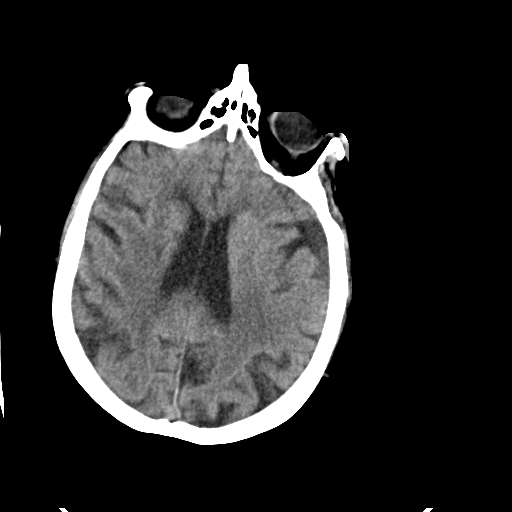
[im 23/36  brain]
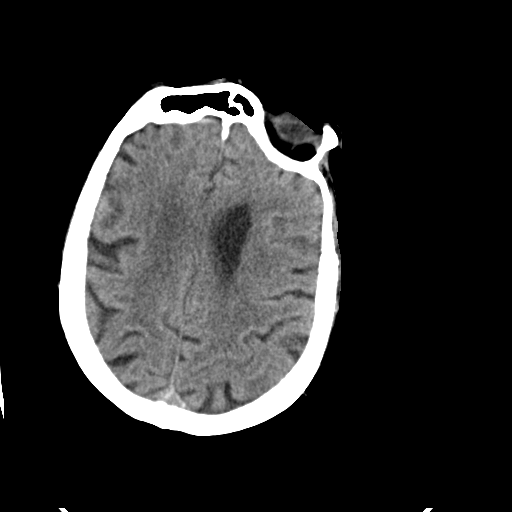
[im 23/36  bone]
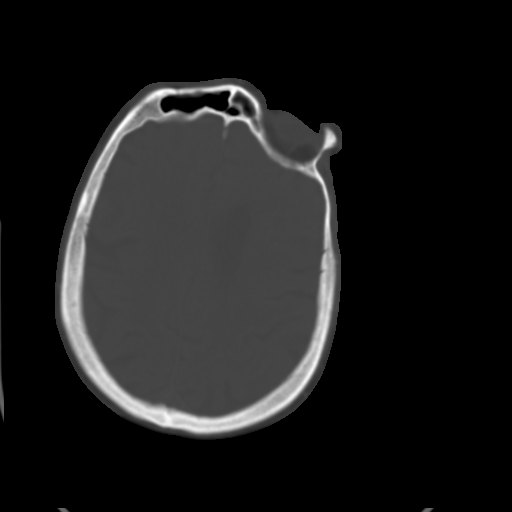
[im 26/36  brain]
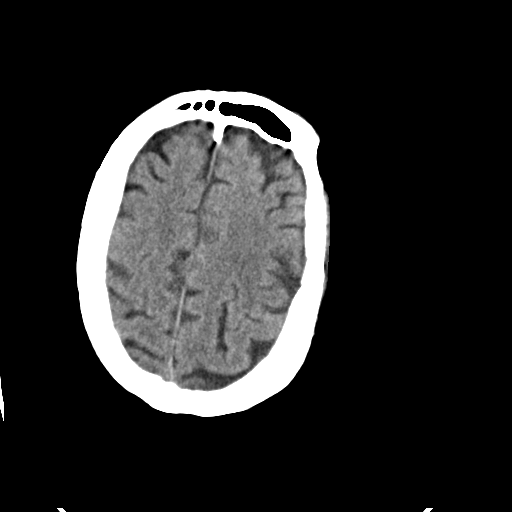
[im 28/36  brain]
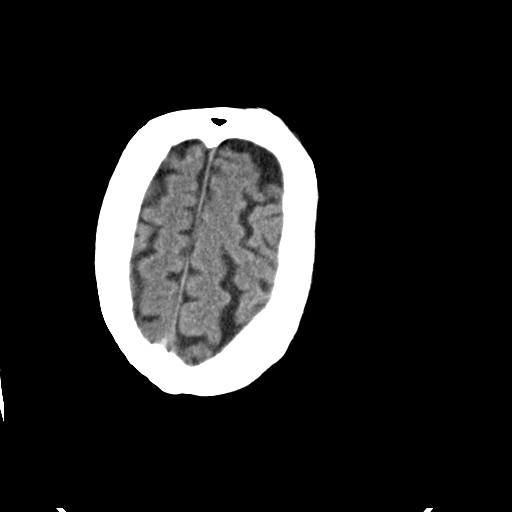
[im 31/36  brain]
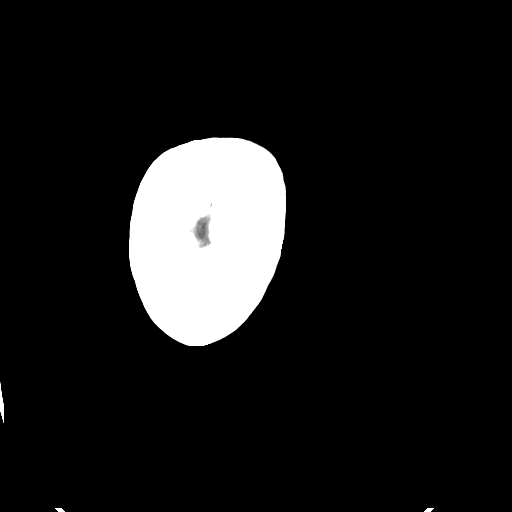
[im 33/36  brain]
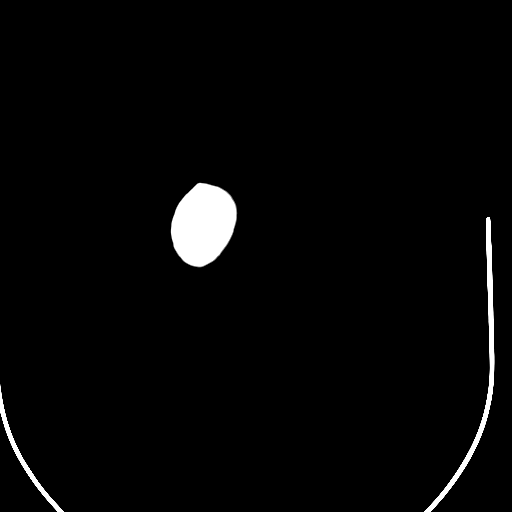
[im 33/36  bone]
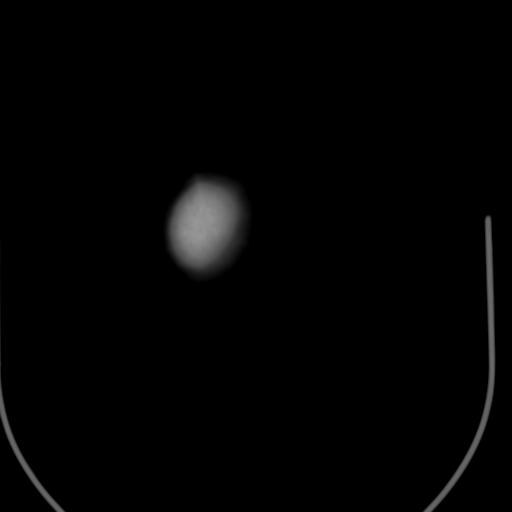

[Series 6: routine head bone · axial · 0.45mm/px · z∈[-37,+11]mm · 3 of 36 slices shown]
[im 3/36  bone]
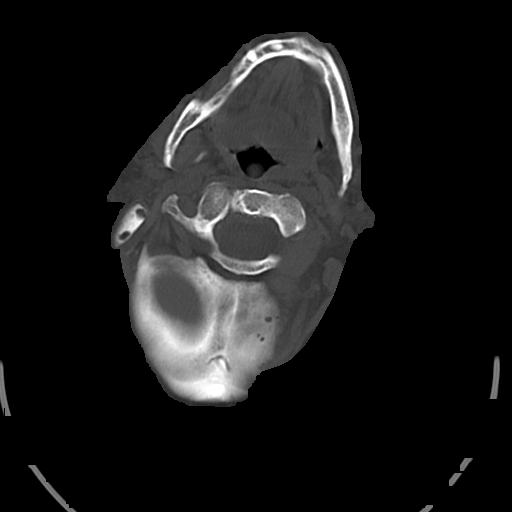
[im 8/36  bone]
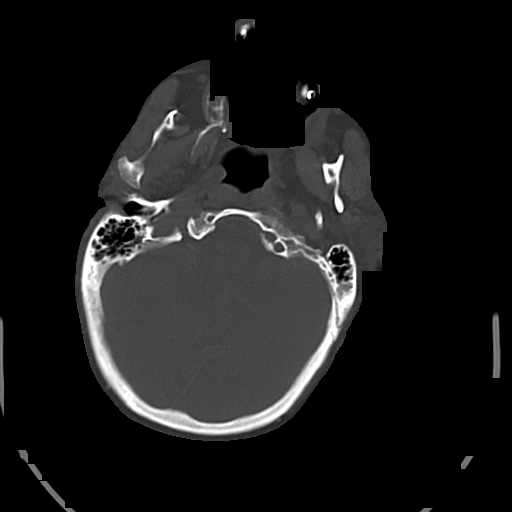
[im 13/36  bone]
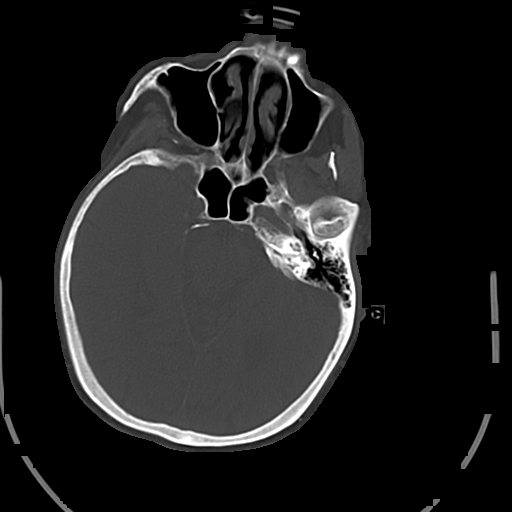

[16 of 30 positions shown; findings below may reference images not displayed]

FINDINGS: Brain: No evidence of acute infarction, hemorrhage, hydrocephalus,
extra-axial collection or mass lesion/mass effect. Mild chronic
microvascular ischemic change noted.

Vascular: No hyperdense vessel or unexpected calcification.

Skull: Intact.  No fracture or focal lesion.

Sinuses/Orbits: Negative.

Other: None.
IMPRESSION: No acute abnormality.

Mild chronic microvascular ischemic change.
